# Patient Record
Sex: Female | Born: 1960
Health system: Southern US, Community
[De-identification: ages and names within clinical notes are randomized; demographics above are authoritative.]

## PROBLEM LIST (undated history)

## (undated) DIAGNOSIS — M1990 Unspecified osteoarthritis, unspecified site: Secondary | ICD-10-CM

## (undated) DIAGNOSIS — I10 Essential (primary) hypertension: Secondary | ICD-10-CM

## (undated) DIAGNOSIS — D649 Anemia, unspecified: Secondary | ICD-10-CM

## (undated) DIAGNOSIS — F419 Anxiety disorder, unspecified: Secondary | ICD-10-CM

## (undated) DIAGNOSIS — G4700 Insomnia, unspecified: Secondary | ICD-10-CM

## (undated) HISTORY — PX: REDUCTION MAMMAPLASTY: SUR839

## (undated) HISTORY — PX: ABDOMINAL HYSTERECTOMY: SHX81

## (undated) HISTORY — DX: Insomnia, unspecified: G47.00

---

## 1998-02-21 ENCOUNTER — Ambulatory Visit (HOSPITAL_COMMUNITY): Admission: RE | Admit: 1998-02-21 | Discharge: 1998-02-21 | Payer: Self-pay | Admitting: Emergency Medicine

## 1998-07-10 ENCOUNTER — Ambulatory Visit (HOSPITAL_COMMUNITY): Admission: RE | Admit: 1998-07-10 | Discharge: 1998-07-10 | Payer: Self-pay | Admitting: Emergency Medicine

## 1998-12-11 ENCOUNTER — Ambulatory Visit (HOSPITAL_COMMUNITY): Admission: RE | Admit: 1998-12-11 | Discharge: 1998-12-11 | Payer: Self-pay | Admitting: Internal Medicine

## 1999-01-20 ENCOUNTER — Emergency Department (HOSPITAL_COMMUNITY): Admission: EM | Admit: 1999-01-20 | Discharge: 1999-01-20 | Payer: Self-pay | Admitting: Emergency Medicine

## 1999-01-20 ENCOUNTER — Encounter: Payer: Self-pay | Admitting: Emergency Medicine

## 1999-12-27 ENCOUNTER — Ambulatory Visit (HOSPITAL_COMMUNITY): Admission: RE | Admit: 1999-12-27 | Discharge: 1999-12-27 | Payer: Self-pay | Admitting: *Deleted

## 1999-12-27 ENCOUNTER — Encounter: Payer: Self-pay | Admitting: *Deleted

## 2000-01-31 ENCOUNTER — Ambulatory Visit (HOSPITAL_COMMUNITY): Admission: RE | Admit: 2000-01-31 | Discharge: 2000-01-31 | Payer: Self-pay | Admitting: Emergency Medicine

## 2001-09-28 ENCOUNTER — Ambulatory Visit (HOSPITAL_COMMUNITY): Admission: RE | Admit: 2001-09-28 | Discharge: 2001-09-28 | Payer: Self-pay | Admitting: Family Medicine

## 2004-09-14 ENCOUNTER — Emergency Department (HOSPITAL_COMMUNITY): Admission: EM | Admit: 2004-09-14 | Discharge: 2004-09-15 | Payer: Self-pay | Admitting: *Deleted

## 2005-03-11 ENCOUNTER — Encounter: Admission: RE | Admit: 2005-03-11 | Discharge: 2005-03-11 | Payer: Self-pay | Admitting: Family Medicine

## 2005-12-03 ENCOUNTER — Ambulatory Visit (HOSPITAL_COMMUNITY): Admission: RE | Admit: 2005-12-03 | Discharge: 2005-12-03 | Payer: Self-pay | Admitting: Family Medicine

## 2005-12-18 ENCOUNTER — Ambulatory Visit (HOSPITAL_COMMUNITY): Admission: RE | Admit: 2005-12-18 | Discharge: 2005-12-18 | Payer: Self-pay | Admitting: Family Medicine

## 2005-12-20 ENCOUNTER — Ambulatory Visit (HOSPITAL_COMMUNITY): Admission: RE | Admit: 2005-12-20 | Discharge: 2005-12-20 | Payer: Self-pay | Admitting: Family Medicine

## 2006-02-19 ENCOUNTER — Ambulatory Visit: Payer: Self-pay | Admitting: Hematology and Oncology

## 2006-04-29 ENCOUNTER — Emergency Department (HOSPITAL_COMMUNITY): Admission: EM | Admit: 2006-04-29 | Discharge: 2006-04-29 | Payer: Self-pay | Admitting: Family Medicine

## 2006-08-11 ENCOUNTER — Encounter: Admission: RE | Admit: 2006-08-11 | Discharge: 2006-08-11 | Payer: Self-pay | Admitting: Family Medicine

## 2006-10-17 ENCOUNTER — Ambulatory Visit (HOSPITAL_COMMUNITY): Admission: RE | Admit: 2006-10-17 | Discharge: 2006-10-17 | Payer: Self-pay | Admitting: Family Medicine

## 2007-04-10 ENCOUNTER — Ambulatory Visit (HOSPITAL_COMMUNITY): Admission: RE | Admit: 2007-04-10 | Discharge: 2007-04-10 | Payer: Self-pay | Admitting: Orthopedic Surgery

## 2010-02-12 ENCOUNTER — Encounter: Admission: RE | Admit: 2010-02-12 | Discharge: 2010-02-12 | Payer: Self-pay | Admitting: Obstetrics and Gynecology

## 2010-06-26 ENCOUNTER — Telehealth (INDEPENDENT_AMBULATORY_CARE_PROVIDER_SITE_OTHER): Payer: Self-pay | Admitting: *Deleted

## 2010-06-26 ENCOUNTER — Encounter (INDEPENDENT_AMBULATORY_CARE_PROVIDER_SITE_OTHER): Payer: Self-pay | Admitting: *Deleted

## 2010-07-03 ENCOUNTER — Ambulatory Visit: Payer: Self-pay | Admitting: Gastroenterology

## 2010-07-06 ENCOUNTER — Encounter: Payer: Self-pay | Admitting: Gastroenterology

## 2010-08-26 ENCOUNTER — Encounter: Payer: Self-pay | Admitting: Family Medicine

## 2010-09-04 ENCOUNTER — Ambulatory Visit (HOSPITAL_COMMUNITY)
Admission: RE | Admit: 2010-09-04 | Discharge: 2010-09-04 | Payer: Self-pay | Source: Home / Self Care | Attending: Family Medicine | Admitting: Family Medicine

## 2010-09-06 NOTE — Letter (Signed)
Summary: [facility] Instructions  [facility]  [address] [place], [place] [address]   Phone: [phone]  Fax: [phone]       Heather Diaz    1961/04/20    MRN: [id number]        Procedure Day /Date:07/03/10  TUE     Arrival Time:730 am     Procedure Time:8 am     Location of Procedure:                    X  [facility] (4th Floor)                        PREPARATION FOR COLONOSCOPY WITH MOVIPREP   Starting 5 days prior to your procedure 06/28/10 do not eat nuts, seeds, popcorn, corn, beans, peas,  salads, or any raw vegetables.  Do not take any fiber supplements (e.g. Metamucil, Citrucel, and Benefiber).  THE DAY BEFORE YOUR PROCEDURE         DATE: 07/02/10  DAY: MON  1.  Drink clear liquids the entire day-NO SOLID FOOD  2.  Do not drink anything colored red or purple.  Avoid juices with pulp.  No orange juice.  3.  Drink at least 64 oz. (8 glasses) of fluid/clear liquids during the day to prevent dehydration and help the prep work efficiently.  CLEAR LIQUIDS INCLUDE: Water Jello Ice Popsicles Tea (sugar ok, no milk/cream) Powdered fruit flavored drinks Coffee (sugar ok, no milk/cream) Gatorade Juice: apple, white grape, white cranberry  Lemonade Clear bullion, consomm, broth Carbonated beverages (any kind) Strained chicken noodle soup Hard Candy                             4.  In the morning, mix first dose of MoviPrep solution:    Empty 1 Pouch A and 1 Pouch B into the disposable container    Add lukewarm drinking water to the top line of the container. Mix to dissolve    Refrigerate (mixed solution should be used within 24 hrs)  5.  Begin drinking the prep at 5:00 p.m. The MoviPrep container is divided by 4 marks.   Every 15 minutes drink the solution down to the next mark (approximately 8 oz) until the full liter is complete.   6.  Follow completed prep with 16 oz of clear liquid of your choice (Nothing red or purple).   Continue to drink clear liquids until bedtime.  7.  Before going to bed, mix second dose of MoviPrep solution:    Empty 1 Pouch A and 1 Pouch B into the disposable container    Add lukewarm drinking water to the top line of the container. Mix to dissolve    Refrigerate  THE DAY OF YOUR PROCEDURE      DATE:07/03/10  DAY: TUE  Beginning at 3 a.m. (5 hours before procedure):         1. Every 15 minutes, drink the solution down to the next mark (approx 8 oz) until the full liter is complete.  2. Follow completed prep with 16 oz. of clear liquid of your choice.    3. You may drink clear liquids until 6 am (2 HOURS BEFORE PROCEDURE).   MEDICATION INSTRUCTIONS  Unless otherwise instructed, you should take regular prescription medications with a small sip of water   as early as possible the morning of your procedure.  OTHER INSTRUCTIONS  You will need a responsible adult at least 52 years of age to accompany you and drive you home.   This person must remain in the waiting room during your procedure.  Wear loose fitting clothing that is easily removed.  Leave jewelry and other valuables at home.  However, you may wish to bring a book to read or  an iPod/MP3 player to listen to music as you wait for your procedure to start.  Remove all body piercing jewelry and leave at home.  Total time from sign-in until discharge is approximately 2-3 hours.  You should go home directly after your procedure and rest.  You can resume normal activities the  day after your procedure.  The day of your procedure you should not:   Drive   Make legal decisions   Operate machinery   Drink alcohol   Return to work  You will receive specific instructions about eating, activities and medications before you leave.    The above instructions have been reviewed and explained to me by   Chales Abrahams CMA Duncan Dull)  June 26, 2010 8:34 AM     I fully understand and can verbalize  these instructions faxed to pt [phone] Date 06/26/10

## 2010-09-06 NOTE — Letter (Signed)
Summary: Results Letter  [facility]  [address] [place], [place] [address]   Phone: [phone]  Fax: [phone]        July 06, 2010 MRN: [id number]    Heather Diaz [address], [place]  [address]    [name],   As we spoke about already, the polyps removed during your recent procedure were proven to be adenomatous.  These are pre-cancerous polyps that may have grown into cancers if they had not been removed.  Based on current nationally recognized surveillance guidelines, I recommend that you have a repeat colonoscopy in 5 years.  We will therefore put your information in our reminder system and will contact you in 5 years to schedule a repeat procedure.  Please call if you have any questions or concerns.       Sincerely,  Rachael Fee MD  This letter has been electronically signed by your physician.  Appended Document: Results Letter Letter mailed

## 2010-09-06 NOTE — Progress Notes (Signed)
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  She needs a colonoscoyp (routine risk) before end of the year, preferably on a Monday. Can you look at [facility] times, find one that will fit for her.  I'm willing to start at 8am for her if needed, but I think I saw a couple holes in my [facility] schedule for next week.  Moveprep, thanks  call her at [facility] endo today [phone]) and her home number is [phone]  thank you ------------------------------

## 2010-09-06 NOTE — Procedures (Signed)
Summary: Colonoscopy  Patient: Heather Diaz Note: All result statuses are Final unless otherwise noted.  Tests: (1) Colonoscopy (COL)   COL Colonoscopy           DONE     [facility]     [address]     [place], [place]  [address]           COLONOSCOPY PROCEDURE REPORT           PATIENT:  Heather, Diaz  MR#:  [id number]     BIRTHDATE:  [birth year], 48 yrs. old  GENDER:  female     ENDOSCOPIST:  Rachael Fee, MD     PROCEDURE DATE:  07/03/2010     PROCEDURE:  Colonoscopy with biopsy and snare polypectomy     ASA CLASS:  Class II     INDICATIONS:  Routine Risk Screening     MEDICATIONS:   Fentanyl 75 mcg IV, Versed 9 mg IV     DESCRIPTION OF PROCEDURE:   After the risks benefits and     alternatives of the procedure were thoroughly explained, informed     consent was obtained.  Digital rectal exam was performed and     revealed no rectal masses.   The LB [id number]-[id number] endoscope     was introduced through the anus and advanced to the cecum, which     was identified by both the appendix and ileocecal valve, without     limitations.  The quality of the prep was good, using MoviPrep.     The instrument was then slowly withdrawn as the colon was fully     examined.     <<PROCEDUREIMAGES>>     FINDINGS:  There were two small polyps, both were located in     descending colon. One was 1mm across, removed with forceps, the     other was 4mm across, removed with cold snare. Both were retrieved     and sent to pathology (jar 1) (see image5 and image6).  Mild     diverticulosis was found in the sigmoid to descending colon     segments (see image1).  External hemorrhoids were found.  This was     otherwise a normal examination of the colon (see image2, image3,     and image7).   Retroflexed views in the rectum revealed no     abnormalities.    The scope was then withdrawn from the patient     and the procedure completed.     COMPLICATIONS:  None           ENDOSCOPIC  IMPRESSION:     1) Two small polyps, both removed and sent to pathology     2) Mild diverticulosis in the sigmoid to descending colon     segments     3) External hemorrhoids, small     4) Otherwise normal examination           RECOMMENDATIONS:     1) If the polyps removed today are proven to be adenomatous     (pre-cancerous) polyps, you will need a repeat colonoscopy in 5     years. Otherwise you should continue to follow colorectal cancer     screening guidelines for "routine risk" patients with colonoscopy     in 10 years.     2) You will receive a letter within 1-2 weeks with the results     of your biopsy as well as final recommendations. Please  call my     office if you have not received a letter after 3 weeks.           ______________________________     Rachael Fee, MD           n.     eSIGNED:   Rachael Fee at 07/03/2010 09:03 AM           Deatra Robinson, [id number]  Note: An exclamation mark (!) indicates a result that was not dispersed into the flowsheet. Document Creation Date: 07/03/2010 9:03 AM _______________________________________________________________________  (1) Order result status: Final Collection or observation date-time: 07/03/2010 08:56 Requested date-time:  Receipt date-time:  Reported date-time:  Referring Physician:   Ordering Physician: Rob Bunting [phone]) Specimen Source:  Source: Launa Grill Order Number: [id number] Lab site:   Appended Document: Colonoscopy     Procedures Next Due Date:    Colonoscopy: 06/2015

## 2010-09-06 NOTE — Progress Notes (Signed)
Summary: colon  Phone Note Outgoing Call Call back at Work Phone [phone]   Call placed by: Chales Abrahams CMA Duncan Dull),  June 26, 2010 8:32 AM Summary of Call: called and informed Heather Diaz of her procedure and faxed instructions. meds reviewed Initial call taken by: Chales Abrahams CMA Duncan Dull),  June 26, 2010 8:35 AM  New Problems: SPECIAL SCREENING FOR MALIGNANT NEOPLASMS COLON (ICD-V76.51)   New Problems: SPECIAL SCREENING FOR MALIGNANT NEOPLASMS COLON (ICD-V76.51) New/Updated Medications: MOVIPREP 100 GM  SOLR (PEG-KCL-NACL-NASULF-NA ASC-C) As per prep instructions. Prescriptions: MOVIPREP 100 GM  SOLR (PEG-KCL-NACL-NASULF-NA ASC-C) As per prep instructions.  #1 x 0   Entered by:   Chales Abrahams CMA (AAMA)   Authorized by:   Rachael Fee MD   Signed by:   Chales Abrahams CMA (AAMA) on 06/26/2010   Method used:   Electronically to        [facility] [phone]* (retail)       [address]       [place], [place]  [address]       Ph: [phone]       Fax: [phone]   RxID:   [id number]

## 2012-11-12 ENCOUNTER — Other Ambulatory Visit (HOSPITAL_COMMUNITY): Payer: Self-pay | Admitting: Family Medicine

## 2012-11-12 DIAGNOSIS — K8020 Calculus of gallbladder without cholecystitis without obstruction: Secondary | ICD-10-CM

## 2012-11-13 ENCOUNTER — Ambulatory Visit (HOSPITAL_COMMUNITY)
Admission: RE | Admit: 2012-11-13 | Discharge: 2012-11-13 | Disposition: A | Discharge status: Home / Self Care | Payer: 59 | Source: Ambulatory Visit | Attending: Family Medicine | Admitting: Family Medicine

## 2012-11-13 ENCOUNTER — Other Ambulatory Visit (HOSPITAL_COMMUNITY): Payer: Self-pay | Admitting: Family Medicine

## 2012-11-13 DIAGNOSIS — R109 Unspecified abdominal pain: Secondary | ICD-10-CM | POA: Insufficient documentation

## 2012-11-13 DIAGNOSIS — K769 Liver disease, unspecified: Secondary | ICD-10-CM

## 2012-11-13 DIAGNOSIS — K7689 Other specified diseases of liver: Secondary | ICD-10-CM | POA: Insufficient documentation

## 2012-11-13 DIAGNOSIS — K8020 Calculus of gallbladder without cholecystitis without obstruction: Secondary | ICD-10-CM

## 2012-11-13 MED ORDER — GADOBENATE DIMEGLUMINE 529 MG/ML IV SOLN
20.0000 mL | Freq: Once | INTRAVENOUS | Status: AC | PRN
  Administered 2012-11-13: 20 mL via INTRAVENOUS

## 2012-11-19 ENCOUNTER — Other Ambulatory Visit (HOSPITAL_COMMUNITY): Payer: 59

## 2013-04-07 ENCOUNTER — Other Ambulatory Visit (HOSPITAL_COMMUNITY): Payer: Self-pay | Admitting: Family Medicine

## 2013-04-07 ENCOUNTER — Other Ambulatory Visit: Payer: Self-pay

## 2013-04-07 DIAGNOSIS — Z1231 Encounter for screening mammogram for malignant neoplasm of breast: Secondary | ICD-10-CM

## 2013-04-08 ENCOUNTER — Ambulatory Visit (HOSPITAL_COMMUNITY): Payer: 59

## 2013-04-19 ENCOUNTER — Ambulatory Visit (HOSPITAL_COMMUNITY)
Admission: RE | Admit: 2013-04-19 | Discharge: 2013-04-19 | Disposition: A | Discharge status: Home / Self Care | Payer: 59 | Source: Ambulatory Visit | Attending: Family Medicine | Admitting: Family Medicine

## 2013-04-19 DIAGNOSIS — Z1231 Encounter for screening mammogram for malignant neoplasm of breast: Secondary | ICD-10-CM | POA: Insufficient documentation

## 2013-04-21 ENCOUNTER — Other Ambulatory Visit: Payer: Self-pay | Admitting: Family Medicine

## 2013-04-21 DIAGNOSIS — R928 Other abnormal and inconclusive findings on diagnostic imaging of breast: Secondary | ICD-10-CM

## 2013-05-07 ENCOUNTER — Ambulatory Visit
Admission: RE | Admit: 2013-05-07 | Discharge: 2013-05-07 | Disposition: A | Discharge status: Home / Self Care | Payer: 59 | Source: Ambulatory Visit | Attending: Family Medicine | Admitting: Family Medicine

## 2013-05-07 DIAGNOSIS — R928 Other abnormal and inconclusive findings on diagnostic imaging of breast: Secondary | ICD-10-CM

## 2013-12-13 ENCOUNTER — Ambulatory Visit: Payer: 59 | Admitting: *Deleted

## 2014-01-28 ENCOUNTER — Ambulatory Visit: Payer: 59 | Admitting: Dietician

## 2014-02-14 ENCOUNTER — Ambulatory Visit: Payer: 59 | Admitting: *Deleted

## 2014-03-12 ENCOUNTER — Encounter: Payer: 59 | Attending: Family Medicine | Admitting: Dietician

## 2014-03-12 DIAGNOSIS — Z713 Dietary counseling and surveillance: Secondary | ICD-10-CM | POA: Insufficient documentation

## 2014-03-16 NOTE — Progress Notes (Signed)
Patient was seen on 03/12/2014 for the Weight Loss Class at the [facility]. The following learning objectives were met by the patient during this class:   Describe healthy choices in each food group  Describe portion size of foods  Use plate method for meal planning  Demonstrate how to read Nutrition Facts food label  Set realistic goals for weight loss, diet changes, and physical activity.   Goals:  1. Make healthy food choices in each food group.  2. Reduce portion size of foods.  3. Increase fruit and vegetable intake.  4. Use plate method for meal planning.  5. Increase physical activity.   Handouts given:  1. Weight loss tips 2. Meal plan/portion card 2. Plate method  2. Food label handout   

## 2014-03-16 NOTE — Progress Notes (Deleted)
Subjective:    Patient was seen on 03/12/2014 for the Weight Loss Class at the [facility]. The following learning objectives were met by the patient during this class:   Describe healthy choices in each food group  Describe portion size of foods  Use plate method for meal planning  Demonstrate how to read Nutrition Facts food label  Set realistic goals for weight loss, diet changes, and physical activity.   Goals:  1. Make healthy food choices in each food group.  2. Reduce portion size of foods.  3. Increase fruit and vegetable intake.  4. Use plate method for meal planning.  5. Increase physical activity.   Handouts given:  1. Weight loss tips 2. Meal plan/portion card 2. Plate method  2. Food label handout     HPI   Review of Systems     Objective:   Physical Exam     Assessment:         Plan:

## 2014-11-18 ENCOUNTER — Other Ambulatory Visit: Payer: Self-pay | Admitting: *Deleted

## 2014-11-18 NOTE — Patient Outreach (Addendum)
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 She says she is staying on track. Sent return e-mail congratulating her on the successful healthy lifestyle changes and wished her continued success.

## 2014-12-29 ENCOUNTER — Other Ambulatory Visit (HOSPITAL_COMMUNITY): Payer: Self-pay | Admitting: Orthopedic Surgery

## 2014-12-29 DIAGNOSIS — M25511 Pain in right shoulder: Secondary | ICD-10-CM

## 2015-01-20 ENCOUNTER — Ambulatory Visit (HOSPITAL_COMMUNITY): Payer: 59

## 2015-01-20 ENCOUNTER — Other Ambulatory Visit (HOSPITAL_COMMUNITY): Payer: 59

## 2015-02-13 ENCOUNTER — Ambulatory Visit (HOSPITAL_COMMUNITY): Admission: RE | Admit: 2015-02-13 | Payer: 59 | Source: Ambulatory Visit

## 2015-06-09 ENCOUNTER — Encounter: Payer: Self-pay | Admitting: Gastroenterology

## 2015-07-07 ENCOUNTER — Other Ambulatory Visit: Payer: Self-pay | Admitting: *Deleted

## 2015-07-07 NOTE — Patient Outreach (Signed)
Secure e-mail sent to Clydie BraunKaren requesting she schedule [facility] follow up appointment for self management assistance with weight loss and HTN. Bary RichardJanet S. Idil Maslanka RN,CCM,CDE [facility] Care Management Coordinator [facility] Office Phone [phone] Office Fax [phone]

## 2015-08-23 ENCOUNTER — Telehealth: Payer: Self-pay

## 2015-08-30 ENCOUNTER — Telehealth: Payer: Self-pay

## 2015-09-06 DIAGNOSIS — H524 Presbyopia: Secondary | ICD-10-CM | POA: Diagnosis not present

## 2015-09-06 DIAGNOSIS — H5213 Myopia, bilateral: Secondary | ICD-10-CM | POA: Diagnosis not present

## 2015-09-22 DIAGNOSIS — Z0000 Encounter for general adult medical examination without abnormal findings: Secondary | ICD-10-CM | POA: Diagnosis not present

## 2015-09-22 DIAGNOSIS — I10 Essential (primary) hypertension: Secondary | ICD-10-CM | POA: Diagnosis not present

## 2015-09-22 DIAGNOSIS — G4700 Insomnia, unspecified: Secondary | ICD-10-CM | POA: Diagnosis not present

## 2015-09-22 DIAGNOSIS — F419 Anxiety disorder, unspecified: Secondary | ICD-10-CM | POA: Diagnosis not present

## 2015-09-22 DIAGNOSIS — R232 Flushing: Secondary | ICD-10-CM | POA: Diagnosis not present

## 2015-09-22 MED FILL — TRIAMTERENE/HCTZ 75/50 TAB: 75-50 | 90 days supply | Qty: 90 | Fill #0

## 2015-09-22 MED FILL — ESTRADIOL 0.1 MG PATCH: 0.1 | 84 days supply | Qty: 24 | Fill #0

## 2015-09-22 MED FILL — ZOLPIDEM TARTRATE 10 MG TAB: 10 | 90 days supply | Qty: 90 | Fill #0

## 2015-09-22 MED FILL — LISINOPRIL 5 MG TABLET: 5 | 30 days supply | Qty: 30 | Fill #0

## 2015-09-22 MED FILL — METOPROLOL SUCC ER 200 MG T: 200 | 90 days supply | Qty: 90 | Fill #0

## 2015-09-27 ENCOUNTER — Telehealth: Payer: Self-pay

## 2015-10-26 ENCOUNTER — Encounter: Payer: Self-pay | Admitting: *Deleted

## 2015-10-26 ENCOUNTER — Other Ambulatory Visit: Payer: Self-pay | Admitting: *Deleted

## 2015-10-26 NOTE — Patient Outreach (Signed)
Clydie BraunKaren has not been seen for HTN [facility] follow up since 07/06/14 and was a no show for her appointment on 10/26/15 at 9:30 am therefore a termination letter is being sent to her for failure to keep the program appointment policy. Bary RichardJanet S. Ladan Vanderzanden RN,CCM,CDE [facility] Care Management Coordinator [facility] Office Phone [phone] Office Fax [phone]

## 2015-12-18 MED FILL — ZOLPIDEM TARTRATE 10 MG TAB: 10 | 90 days supply | Qty: 90 | Fill #1

## 2015-12-20 MED FILL — ESTRADIOL 0.1 MG PATCH: 0.1 | 84 days supply | Qty: 24 | Fill #1

## 2016-03-18 DIAGNOSIS — G4700 Insomnia, unspecified: Secondary | ICD-10-CM | POA: Diagnosis not present

## 2016-03-18 DIAGNOSIS — I10 Essential (primary) hypertension: Secondary | ICD-10-CM | POA: Diagnosis not present

## 2016-03-18 DIAGNOSIS — E6609 Other obesity due to excess calories: Secondary | ICD-10-CM | POA: Diagnosis not present

## 2016-03-18 MED FILL — METOPROLOL SUCC ER 200 MG T: 200 | 90 days supply | Qty: 90 | Fill #0

## 2016-03-18 MED FILL — ZOLPIDEM TART ER 12.5 MG TA: 12.5 | 30 days supply | Qty: 30 | Fill #0

## 2016-03-18 MED FILL — ALPRAZolam 0.5 MG TABS: 0.5 | 30 days supply | Qty: 30 | Fill #0

## 2016-03-18 MED FILL — TRIAMTERENE/HCTZ 75/50 TAB: 75-50 | 90 days supply | Qty: 90 | Fill #0

## 2016-04-16 MED FILL — ESTRADIOL 0.1 MG PATCH: 0.1 | 84 days supply | Qty: 24 | Fill #0

## 2016-04-16 MED FILL — ZOLPIDEM TART ER 12.5 MG TA: 12.5 | 30 days supply | Qty: 30 | Fill #1 | Status: TO

## 2016-05-16 MED FILL — ZOLPIDEM TART ER 12.5 MG TA: 12.5 | 30 days supply | Qty: 30 | Fill #0 | Status: TO

## 2016-06-11 MED FILL — ZOLPIDEM TART ER 12.5 MG TA: 12.5 | 90 days supply | Qty: 90 | Fill #0

## 2016-07-09 MED FILL — ZOLPIDEM TARTRATE 10 MG TAB: 10 | 90 days supply | Qty: 90 | Fill #0

## 2016-09-23 DIAGNOSIS — Z1211 Encounter for screening for malignant neoplasm of colon: Secondary | ICD-10-CM | POA: Diagnosis not present

## 2016-09-23 DIAGNOSIS — L989 Disorder of the skin and subcutaneous tissue, unspecified: Secondary | ICD-10-CM | POA: Diagnosis not present

## 2016-09-23 DIAGNOSIS — F5101 Primary insomnia: Secondary | ICD-10-CM | POA: Diagnosis not present

## 2016-09-23 DIAGNOSIS — N951 Menopausal and female climacteric states: Secondary | ICD-10-CM | POA: Diagnosis not present

## 2016-09-23 DIAGNOSIS — I10 Essential (primary) hypertension: Secondary | ICD-10-CM | POA: Diagnosis not present

## 2016-09-23 DIAGNOSIS — F4010 Social phobia, unspecified: Secondary | ICD-10-CM | POA: Diagnosis not present

## 2016-09-23 DIAGNOSIS — Z0000 Encounter for general adult medical examination without abnormal findings: Secondary | ICD-10-CM | POA: Diagnosis not present

## 2016-09-23 MED FILL — ESTRADIOL 0.1 MG PATCH: 0.1 | 84 days supply | Qty: 24 | Fill #0

## 2016-09-24 DIAGNOSIS — H5213 Myopia, bilateral: Secondary | ICD-10-CM | POA: Diagnosis not present

## 2016-09-24 DIAGNOSIS — H524 Presbyopia: Secondary | ICD-10-CM | POA: Diagnosis not present

## 2016-09-25 MED FILL — XIIDRA 5% EYE DROPS: 5 | 30 days supply | Qty: 60 | Fill #0

## 2016-09-27 MED FILL — TRINTELLIX 10 MG TABLET: 10 | 30 days supply | Qty: 30 | Fill #0

## 2016-11-12 MED FILL — DESVENLAFAXINE SUC ER 25 MG: 25 | 30 days supply | Qty: 30 | Fill #0

## 2016-11-25 MED FILL — ZOLPIDEM TARTRATE 10 MG TAB: 10 | 90 days supply | Qty: 90 | Fill #1

## 2017-01-20 ENCOUNTER — Telehealth: Payer: 59 | Admitting: Family

## 2017-01-20 DIAGNOSIS — L255 Unspecified contact dermatitis due to plants, except food: Secondary | ICD-10-CM | POA: Diagnosis not present

## 2017-01-20 MED ORDER — PREDNISONE 10 MG (21) PO TBPK: ORAL_TABLET | ORAL | 0 refills | Status: DC

## 2017-01-20 MED FILL — TRIAMCINOLONE 0.1% CREAM: 0.1 | 10 days supply | Qty: 30 | Fill #0

## 2017-01-20 NOTE — Progress Notes (Signed)

## 2017-02-21 MED FILL — ZOLPIDEM TARTRATE 10 MG TAB: 10 | 90 days supply | Qty: 90 | Fill #0

## 2017-05-22 MED FILL — ZOLPIDEM TARTRATE 10 MG TAB: 10 | 90 days supply | Qty: 90 | Fill #0 | Status: TO

## 2017-08-15 MED FILL — ESTRADIOL 0.1 MG PATCH: 0.1 | 84 days supply | Qty: 24 | Fill #1

## 2017-08-19 MED FILL — ZOLPIDEM TARTRATE 10 MG TAB: 10 | 90 days supply | Qty: 90 | Fill #0

## 2017-08-19 MED FILL — XIIDRA 5% EYE DROPS: 5 | 30 days supply | Qty: 60 | Fill #0

## 2017-08-20 DIAGNOSIS — H5213 Myopia, bilateral: Secondary | ICD-10-CM | POA: Diagnosis not present

## 2017-08-20 DIAGNOSIS — H524 Presbyopia: Secondary | ICD-10-CM | POA: Diagnosis not present

## 2017-10-01 DIAGNOSIS — L920 Granuloma annulare: Secondary | ICD-10-CM | POA: Diagnosis not present

## 2017-10-01 DIAGNOSIS — M25561 Pain in right knee: Secondary | ICD-10-CM | POA: Diagnosis not present

## 2017-10-01 MED FILL — ACETAMINOPHEN/COD #3 TABLET: 300-30 | 5 days supply | Qty: 30 | Fill #0

## 2017-10-01 MED FILL — CLOBETASOL 0.05% CREAM: 0.05 | 20 days supply | Qty: 60 | Fill #0

## 2017-10-01 MED FILL — MELOXICAM 15 MG TABLET: 15 | 14 days supply | Qty: 14 | Fill #0

## 2017-10-30 ENCOUNTER — Encounter (INDEPENDENT_AMBULATORY_CARE_PROVIDER_SITE_OTHER): Payer: Self-pay | Admitting: Orthopaedic Surgery

## 2017-10-30 ENCOUNTER — Ambulatory Visit (INDEPENDENT_AMBULATORY_CARE_PROVIDER_SITE_OTHER): Payer: 59

## 2017-10-30 ENCOUNTER — Ambulatory Visit (INDEPENDENT_AMBULATORY_CARE_PROVIDER_SITE_OTHER): Payer: 59 | Admitting: Orthopaedic Surgery

## 2017-10-30 DIAGNOSIS — M25561 Pain in right knee: Secondary | ICD-10-CM

## 2017-10-30 DIAGNOSIS — G8929 Other chronic pain: Secondary | ICD-10-CM | POA: Diagnosis not present

## 2017-10-30 MED ORDER — MELOXICAM 7.5 MG PO TABS: 7.5000 mg | ORAL_TABLET | Freq: Two times a day (BID) | ORAL | 2 refills | Status: DC | PRN

## 2017-10-30 MED ORDER — METHYLPREDNISOLONE ACETATE 40 MG/ML IJ SUSP
40.0000 mg | INTRAMUSCULAR | Status: AC | PRN
  Administered 2017-10-30: 40 mg via INTRA_ARTICULAR

## 2017-10-30 MED ORDER — BUPIVACAINE HCL 0.5 % IJ SOLN
2.0000 mL | INTRAMUSCULAR | Status: AC | PRN
  Administered 2017-10-30: 2 mL via INTRA_ARTICULAR

## 2017-10-30 MED ORDER — DICLOFENAC SODIUM 1 % TD GEL: 2.0000 g | Freq: Four times a day (QID) | TRANSDERMAL | 5 refills | Status: DC

## 2017-10-30 MED ORDER — LIDOCAINE HCL 1 % IJ SOLN
2.0000 mL | INTRAMUSCULAR | Status: AC | PRN
  Administered 2017-10-30: 2 mL

## 2017-10-30 MED FILL — MELOXICAM 7.5 MG TABLET: 7.5 | 30 days supply | Qty: 60 | Fill #0

## 2017-10-30 MED FILL — DICLOFENAC SODIUM 1% GEL: 1 | 12 days supply | Qty: 100 | Fill #0

## 2017-10-30 NOTE — Progress Notes (Signed)
Office Visit Note   Patient: Heather Diaz           Date of Birth: 1960-11-30           MRN: [id number] Visit Date: 10/30/2017              Requested by: Laurann Montana, MD [phone] [address] [place], [place] [address] PCP: Laurann Montana, MD   Assessment & Plan: Visit Diagnoses:  1. Chronic pain of right knee     Plan: Impression is right knee osteoarthritis.  Cortisone injection performed today.  We did discuss the overall condition.  She is working on diet and exercise.  Prescription for Voltaren gel and meloxicam.  Questions encouraged and answered.  I gave her a pamphlet on HA injections.  Follow-Up Instructions: Return if symptoms worsen or fail to improve.   Orders:  Orders Placed This Encounter  Procedures  . XR KNEE 3 VIEW RIGHT   Meds ordered this encounter  Medications  . diclofenac sodium (VOLTAREN) 1 % GEL    Sig: Apply 2 g topically 4 (four) times daily.    Dispense:  1 Tube    Refill:  5  . meloxicam (MOBIC) 7.5 MG tablet    Sig: Take 1 tablet (7.5 mg total) by mouth 2 (two) times daily as needed for pain.    Dispense:  60 tablet    Refill:  2      Procedures: Large Joint Inj: R knee on 10/30/2017 9:29 AM Indications: pain Details: 22 G needle  Arthrogram: No  Medications: 40 mg methylPREDNISolone acetate 40 MG/ML; 2 mL lidocaine 1 %; 2 mL bupivacaine 0.5 % Outcome: tolerated well, no immediate complications Consent was given by the patient. Patient was prepped and draped in the usual sterile fashion.       Clinical Data: No additional findings.   Subjective: Chief Complaint  Patient presents with  . 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  She does endorse start up stiffness.  Symptoms.  She does endorse some popping but no giving way.  She does  endorse some occasional pain radiating down the leg and across the front of the knee.  Meloxicam does help partially.  Denies any numbness and tingling.   Review of Systems  Constitutional: Negative.   HENT: Negative.   Eyes: Negative.   Respiratory: Negative.   Cardiovascular: Negative.   Endocrine: Negative.   Musculoskeletal: Negative.   Neurological: Negative.   Hematological: Negative.   Psychiatric/Behavioral: Negative.   All other systems reviewed and are negative.    Objective: Vital Signs: There were no vitals taken for this visit.  Physical Exam  Constitutional: She is oriented to person, place, and time. She appears well-developed and well-nourished.  HENT:  Head: Normocephalic and atraumatic.  Eyes: EOM are normal.  Neck: Neck supple.  Pulmonary/Chest: Effort normal.  Abdominal: Soft.  Neurological: She is alert and oriented to person, place, and time.  Skin: Skin is warm. Capillary refill takes less than 2 seconds.  Psychiatric: She has a normal mood and affect. Her behavior is normal. Judgment and thought content normal.  Nursing note and vitals reviewed.   Ortho Exam Right knee exam shows no joint effusion.  Collaterals and cruciates are stable.  1+ patellofemoral. Specialty Comments:  No specialty comments available.  Imaging: Xr Knee 3 View Right  Result Date: 10/30/2017 Mild osteoarthritis.    PMFS History: There are no active problems to display for this patient.  No past medical history on file.  No family history on file.   Social History   Occupational History  . Not on file  Tobacco Use  . Smoking status: Never Smoker  . Smokeless tobacco: Never Used  Substance and Sexual Activity  . Alcohol use: Not on file  . Drug use: Not on file  . Sexual activity: Not on file

## 2017-11-17 MED FILL — ZOLPIDEM TARTRATE 10 MG TAB: 10 | 90 days supply | Qty: 90 | Fill #0

## 2017-12-17 ENCOUNTER — Telehealth (INDEPENDENT_AMBULATORY_CARE_PROVIDER_SITE_OTHER): Payer: Self-pay | Admitting: Orthopaedic Surgery

## 2017-12-17 NOTE — Telephone Encounter (Signed)
10/30/2017 ov note faxed to [facility] [phone]

## 2017-12-22 MED FILL — ESTRADIOL 0.05 MG/24HR PTTW: 0.05 | 84 days supply | Qty: 24 | Fill #0

## 2018-01-09 DIAGNOSIS — G4700 Insomnia, unspecified: Secondary | ICD-10-CM | POA: Diagnosis not present

## 2018-01-09 DIAGNOSIS — I10 Essential (primary) hypertension: Secondary | ICD-10-CM | POA: Diagnosis not present

## 2018-01-09 DIAGNOSIS — F419 Anxiety disorder, unspecified: Secondary | ICD-10-CM | POA: Diagnosis not present

## 2018-01-09 DIAGNOSIS — Z13220 Encounter for screening for lipoid disorders: Secondary | ICD-10-CM | POA: Diagnosis not present

## 2018-01-09 DIAGNOSIS — Z0000 Encounter for general adult medical examination without abnormal findings: Secondary | ICD-10-CM | POA: Diagnosis not present

## 2018-01-09 DIAGNOSIS — N951 Menopausal and female climacteric states: Secondary | ICD-10-CM | POA: Diagnosis not present

## 2018-01-09 MED FILL — METOPROLOL SUCCINATE ER 200: 200 | 90 days supply | Qty: 90 | Fill #0

## 2018-01-09 MED FILL — ZOLPIDEM TART ER 12.5 MG TA: 12.5 | 90 days supply | Qty: 90 | Fill #0

## 2018-01-09 MED FILL — TRIAMTERENE-HCTZ 75/50 TAB: 75-50 | 90 days supply | Qty: 90 | Fill #0

## 2018-01-15 DIAGNOSIS — Z131 Encounter for screening for diabetes mellitus: Secondary | ICD-10-CM | POA: Diagnosis not present

## 2018-01-15 DIAGNOSIS — E2839 Other primary ovarian failure: Secondary | ICD-10-CM | POA: Diagnosis not present

## 2018-01-15 DIAGNOSIS — E669 Obesity, unspecified: Secondary | ICD-10-CM | POA: Diagnosis not present

## 2018-01-15 DIAGNOSIS — I10 Essential (primary) hypertension: Secondary | ICD-10-CM | POA: Diagnosis not present

## 2018-01-15 DIAGNOSIS — R5383 Other fatigue: Secondary | ICD-10-CM | POA: Diagnosis not present

## 2018-01-15 DIAGNOSIS — E559 Vitamin D deficiency, unspecified: Secondary | ICD-10-CM | POA: Diagnosis not present

## 2018-01-28 DIAGNOSIS — R5383 Other fatigue: Secondary | ICD-10-CM | POA: Diagnosis not present

## 2018-01-28 DIAGNOSIS — E669 Obesity, unspecified: Secondary | ICD-10-CM | POA: Diagnosis not present

## 2018-01-28 DIAGNOSIS — E2839 Other primary ovarian failure: Secondary | ICD-10-CM | POA: Diagnosis not present

## 2018-01-28 DIAGNOSIS — I10 Essential (primary) hypertension: Secondary | ICD-10-CM | POA: Diagnosis not present

## 2018-01-28 MED FILL — PROGESTERONE 100 MG CAPSULE: 100 | 30 days supply | Qty: 30 | Fill #0

## 2018-01-28 MED FILL — ESTRADIOL 0.5 MG TABS: 0.5 | 30 days supply | Qty: 30 | Fill #0

## 2018-02-26 MED FILL — ZOLPIDEM TARTRATE 10 MG TAB: 10 | 90 days supply | Qty: 90 | Fill #0

## 2018-03-31 DIAGNOSIS — I10 Essential (primary) hypertension: Secondary | ICD-10-CM | POA: Diagnosis not present

## 2018-03-31 DIAGNOSIS — E669 Obesity, unspecified: Secondary | ICD-10-CM | POA: Diagnosis not present

## 2018-03-31 DIAGNOSIS — E559 Vitamin D deficiency, unspecified: Secondary | ICD-10-CM | POA: Diagnosis not present

## 2018-03-31 DIAGNOSIS — G4700 Insomnia, unspecified: Secondary | ICD-10-CM | POA: Diagnosis not present

## 2018-03-31 DIAGNOSIS — E2839 Other primary ovarian failure: Secondary | ICD-10-CM | POA: Diagnosis not present

## 2018-05-25 MED FILL — ZOLPIDEM TARTRATE 10 MG TAB: 10 | 90 days supply | Qty: 90 | Fill #1

## 2018-07-27 MED FILL — ESTRADIOL 0.05 MG/24HR PTTW: 0.05 | 84 days supply | Qty: 24 | Fill #1

## 2018-08-24 MED FILL — ZOLPIDEM TARTRATE 10 MG TAB: 10 | 90 days supply | Qty: 90 | Fill #0

## 2018-09-25 MED FILL — ESTRADIOL 0.075 MG/24HR PTT: 0.075 | 84 days supply | Qty: 24 | Fill #0

## 2018-10-21 MED FILL — TRIAMTERENE-HCTZ 75/50 TAB: 75-50 | 90 days supply | Qty: 90 | Fill #1

## 2018-11-09 MED FILL — ALPRAZolam 0.5 MG TABS: 0.5 | 30 days supply | Qty: 30 | Fill #0

## 2018-11-18 MED FILL — ZOLPIDEM TARTRATE 10 MG TAB: 10 | 90 days supply | Qty: 90 | Fill #1

## 2018-12-05 MED FILL — ALPRAZolam 0.5 MG TABS: 0.5 | 30 days supply | Qty: 30 | Fill #0

## 2019-02-15 MED FILL — ESTRADIOL 0.075 MG/24HR PTT: 0.075 | 84 days supply | Qty: 24 | Fill #1

## 2019-02-15 MED FILL — ZOLPIDEM TARTRATE 10 MG TAB: 10 | 90 days supply | Qty: 90 | Fill #0

## 2019-02-15 MED FILL — ALPRAZolam 0.5 MG TABS: 0.5 | 30 days supply | Qty: 30 | Fill #0

## 2019-04-08 DIAGNOSIS — E2839 Other primary ovarian failure: Secondary | ICD-10-CM | POA: Diagnosis not present

## 2019-04-08 DIAGNOSIS — G4700 Insomnia, unspecified: Secondary | ICD-10-CM | POA: Diagnosis not present

## 2019-04-08 DIAGNOSIS — I10 Essential (primary) hypertension: Secondary | ICD-10-CM | POA: Diagnosis not present

## 2019-04-08 DIAGNOSIS — F419 Anxiety disorder, unspecified: Secondary | ICD-10-CM | POA: Diagnosis not present

## 2019-04-08 DIAGNOSIS — D569 Thalassemia, unspecified: Secondary | ICD-10-CM | POA: Diagnosis not present

## 2019-04-08 DIAGNOSIS — Z0000 Encounter for general adult medical examination without abnormal findings: Secondary | ICD-10-CM | POA: Diagnosis not present

## 2019-04-08 MED FILL — METOPROLOL SUCCINATE ER 100: 100 | 90 days supply | Qty: 90 | Fill #0

## 2019-04-08 MED FILL — ALPRAZolam 0.5 MG TABS: 0.5 | 30 days supply | Qty: 30 | Fill #0

## 2019-04-08 MED FILL — TRIAMTERENE-HCTZ 75/50 TAB: 75-50 | 90 days supply | Qty: 90 | Fill #0

## 2019-04-15 ENCOUNTER — Other Ambulatory Visit: Payer: Self-pay | Admitting: Family Medicine

## 2019-04-15 DIAGNOSIS — Z1231 Encounter for screening mammogram for malignant neoplasm of breast: Secondary | ICD-10-CM

## 2019-04-15 DIAGNOSIS — E2839 Other primary ovarian failure: Secondary | ICD-10-CM

## 2019-04-27 MED FILL — ALPRAZolam 0.5 MG TABS: 0.5 | 30 days supply | Qty: 30 | Fill #0

## 2019-04-27 MED FILL — METOPROLOL SUCCINATE ER 100: 100 | 90 days supply | Qty: 90 | Fill #0

## 2019-04-27 MED FILL — TRIAMTERENE-HCTZ 75/50 TAB: 75-50 | 90 days supply | Qty: 90 | Fill #0

## 2019-05-15 MED FILL — ZOLPIDEM TARTRATE 10 MG TAB: 10 | 90 days supply | Qty: 90 | Fill #0

## 2019-05-27 DIAGNOSIS — D569 Thalassemia, unspecified: Secondary | ICD-10-CM | POA: Diagnosis not present

## 2019-05-27 DIAGNOSIS — I10 Essential (primary) hypertension: Secondary | ICD-10-CM | POA: Diagnosis not present

## 2019-06-29 ENCOUNTER — Ambulatory Visit
Admission: RE | Admit: 2019-06-29 | Discharge: 2019-06-29 | Disposition: A | Discharge status: Home / Self Care | Payer: 59 | Source: Ambulatory Visit | Attending: Family Medicine | Admitting: Family Medicine

## 2019-06-29 ENCOUNTER — Other Ambulatory Visit: Payer: Self-pay

## 2019-06-29 DIAGNOSIS — Z1231 Encounter for screening mammogram for malignant neoplasm of breast: Secondary | ICD-10-CM

## 2019-06-29 DIAGNOSIS — E2839 Other primary ovarian failure: Secondary | ICD-10-CM

## 2019-06-29 DIAGNOSIS — Z780 Asymptomatic menopausal state: Secondary | ICD-10-CM | POA: Diagnosis not present

## 2019-06-29 DIAGNOSIS — Z13820 Encounter for screening for osteoporosis: Secondary | ICD-10-CM | POA: Diagnosis not present

## 2019-07-12 ENCOUNTER — Encounter (HOSPITAL_COMMUNITY): Payer: Self-pay

## 2019-07-12 ENCOUNTER — Emergency Department (HOSPITAL_COMMUNITY)
Admission: EM | Admit: 2019-07-12 | Discharge: 2019-07-13 | Disposition: A | Discharge status: Home / Self Care | Payer: 59 | Attending: Emergency Medicine | Admitting: Emergency Medicine

## 2019-07-12 ENCOUNTER — Other Ambulatory Visit: Payer: Self-pay

## 2019-07-12 DIAGNOSIS — R519 Headache, unspecified: Secondary | ICD-10-CM | POA: Diagnosis not present

## 2019-07-12 DIAGNOSIS — Z79899 Other long term (current) drug therapy: Secondary | ICD-10-CM | POA: Diagnosis not present

## 2019-07-12 DIAGNOSIS — I10 Essential (primary) hypertension: Secondary | ICD-10-CM | POA: Insufficient documentation

## 2019-07-12 HISTORY — DX: Essential (primary) hypertension: I10

## 2019-07-12 MED ORDER — HYDRALAZINE HCL 20 MG/ML IJ SOLN
10.0000 mg | Freq: Once | INTRAMUSCULAR | Status: AC
  Administered 2019-07-13: 10 mg via INTRAVENOUS
  Filled 2019-07-12: qty 1

## 2019-07-12 NOTE — ED Notes (Signed)
Pt states she has right temporal headache. PT denies visual changes or blurry vision.

## 2019-07-12 NOTE — ED Notes (Signed)
Pt states took dose of her Maxide approximately 5pm

## 2019-07-12 NOTE — ED Triage Notes (Signed)
Pt reports head started hurting around 2pm today and she had her BP checked which was around 200/114. Pt reports taking Tylenol and Advil without any relief. Pt reports headache has gotten worse throughout the day. Pt reports taking BP medications today. Pt is A&O x4. Neuro is intact.

## 2019-07-13 ENCOUNTER — Emergency Department (HOSPITAL_COMMUNITY): Payer: 59

## 2019-07-13 DIAGNOSIS — R519 Headache, unspecified: Secondary | ICD-10-CM | POA: Diagnosis not present

## 2019-07-13 DIAGNOSIS — Z79899 Other long term (current) drug therapy: Secondary | ICD-10-CM | POA: Diagnosis not present

## 2019-07-13 DIAGNOSIS — I10 Essential (primary) hypertension: Secondary | ICD-10-CM | POA: Diagnosis not present

## 2019-07-13 LAB — CBC WITH DIFFERENTIAL/PLATELET
Abs Immature Granulocytes: 0.03 10*3/uL (ref 0.00–0.07)
Basophils Absolute: 0 10*3/uL (ref 0.0–0.1)
Basophils Relative: 0 %
Eosinophils Absolute: 0.2 10*3/uL (ref 0.0–0.5)
Eosinophils Relative: 2 %
HCT: 41.4 % (ref 36.0–46.0)
Hemoglobin: 11.7 g/dL — ABNORMAL LOW (ref 12.0–15.0)
Immature Granulocytes: 0 %
Lymphocytes Relative: 30 %
Lymphs Abs: 2.3 10*3/uL (ref 0.7–4.0)
MCH: 16.7 pg — ABNORMAL LOW (ref 26.0–34.0)
MCHC: 28.3 g/dL — ABNORMAL LOW (ref 30.0–36.0)
MCV: 59 fL — ABNORMAL LOW (ref 80.0–100.0)
Monocytes Absolute: 0.5 10*3/uL (ref 0.1–1.0)
Monocytes Relative: 6 %
Neutro Abs: 4.6 10*3/uL (ref 1.7–7.7)
Neutrophils Relative %: 62 %
Platelets: 310 10*3/uL (ref 150–400)
RBC: 7.02 MIL/uL — ABNORMAL HIGH (ref 3.87–5.11)
RDW: 19.1 % — ABNORMAL HIGH (ref 11.5–15.5)
WBC: 7.6 10*3/uL (ref 4.0–10.5)
nRBC: 0 % (ref 0.0–0.2)

## 2019-07-13 LAB — BASIC METABOLIC PANEL
Anion gap: 11 (ref 5–15)
BUN: 11 mg/dL (ref 6–20)
CO2: 28 mmol/L (ref 22–32)
Calcium: 9.9 mg/dL (ref 8.9–10.3)
Chloride: 98 mmol/L (ref 98–111)
Creatinine, Ser: 0.82 mg/dL (ref 0.44–1.00)
GFR calc Af Amer: >60 mL/min (ref >60)
GFR calc non Af Amer: >60 mL/min (ref >60)
Glucose, Bld: 124 mg/dL — ABNORMAL HIGH (ref 70–99)
Potassium: 3.2 mmol/L — ABNORMAL LOW (ref 3.5–5.1)
Sodium: 137 mmol/L (ref 135–145)

## 2019-07-13 MED ORDER — KETOROLAC TROMETHAMINE 30 MG/ML IJ SOLN
15.0000 mg | Freq: Once | INTRAMUSCULAR | Status: AC
  Administered 2019-07-13: 15 mg via INTRAVENOUS
  Filled 2019-07-13: qty 1

## 2019-07-13 NOTE — Discharge Instructions (Addendum)
Schedule a recheck of your blood pressure at your primary care doctor's office.

## 2019-07-13 NOTE — ED Provider Notes (Signed)
[facility] DEPT Provider Note   CSN: [id number] Arrival date & time: 07/12/19  2244     History   Chief Complaint No chief complaint on file.   HPI Heather Diaz is a 58 y.o. female.     Patient presents to the emergency department for evaluation of elevated blood pressure and headache.  She reports that she noticed a global headache earlier today.  She checked her blood pressure and it was elevated.  She took her scheduled medications but blood pressure did not improve.  She talked to her primary doctor who said she should go to urgent care or the ER.  She monitored her blood pressure which did not improve.  She comes to the ER stating that she is now experiencing predominantly right-sided headache.  She has not had any vision change.  She denies chest pain, heart palpitations, shortness of breath.  Patient denies unilateral numbness, tingling, weakness.     Past Medical History:  Diagnosis Date   Hypertension     There are no active problems to display for this patient.   Past Surgical History:  Procedure Laterality Date   ABDOMINAL HYSTERECTOMY     REDUCTION MAMMAPLASTY Bilateral      OB History   No obstetric history on file.      Home Medications    Prior to Admission medications   Medication Sig Start Date End Date Taking? Authorizing Provider  acetaminophen (TYLENOL) 500 MG tablet Take 1,000 mg by mouth every 6 (six) hours as needed for mild pain.   Yes [provider]  cholecalciferol (VITAMIN D3) 25 MCG (1000 UT) tablet Take 1,000 Units by mouth daily.   Yes [provider]  estradiol (VIVELLE-DOT) 0.075 MG/24HR Place 1 patch onto the skin every 3 (three) days. 02/15/19  Yes [provider]  ibuprofen (ADVIL) 200 MG tablet Take 400 mg by mouth every 6 (six) hours as needed for moderate pain.   Yes [provider]  Iron-Vitamins (GERITOL COMPLETE PO) Take 1 tablet by mouth daily.   Yes  [provider]  metoprolol succinate (TOPROL-XL) 100 MG 24 hr tablet Take 100 mg by mouth daily. 04/27/19  Yes [provider]  Multiple Vitamins-Minerals (ADULT GUMMY) CHEW Chew 1 each by mouth daily.   Yes [provider]  triamterene-hydrochlorothiazide (MAXZIDE) 75-50 MG tablet Take 1 tablet by mouth daily. 04/27/19  Yes [provider]  zolpidem (AMBIEN) 10 MG tablet Take 10 mg by mouth at bedtime as needed for sleep.  08/19/17  Yes [provider]  diclofenac sodium (VOLTAREN) 1 % GEL Apply 2 g topically 4 (four) times daily. Patient not taking: Reported on 07/12/2019 10/30/17   Leandrew Koyanagi, MD  meloxicam (MOBIC) 7.5 MG tablet Take 1 tablet (7.5 mg total) by mouth 2 (two) times daily as needed for pain. Patient not taking: Reported on 07/12/2019 10/30/17   Leandrew Koyanagi, MD  predniSONE (STERAPRED UNI-PAK 21 TAB) 10 MG (21) TBPK tablet Use as directed Patient not taking: Reported on 07/12/2019 01/20/17   Sharion Balloon, FNP    Family History History reviewed. No pertinent family history.  Social History Social History   Tobacco Use   Smoking status: Never Smoker   Smokeless tobacco: Never Used  Substance Use Topics   Alcohol use: Not on file   Drug use: Not on file     Allergies   Reglan [metoclopramide]   Review of Systems Review of Systems  Eyes:  Negative for visual disturbance.  Respiratory: Negative for shortness of breath.   Cardiovascular: Negative for chest pain.  Neurological: Positive for dizziness and headaches.  All other systems reviewed and are negative.    Physical Exam Updated Vital Signs BP (!) 156/92 (BP Location: Right Arm)    Pulse 68    Temp 98.4 F (36.9 C) (Oral)    Resp 15    Ht 5\' 7"  (1.702 m)    Wt 86.2 kg    SpO2 97%    BMI 29.76 kg/m   Physical Exam Vitals signs and nursing note reviewed.  Constitutional:      General: She is not in acute distress.    Appearance: Normal appearance. She is  well-developed.  HENT:     Head: Normocephalic and atraumatic.     Right Ear: Hearing normal.     Left Ear: Hearing normal.     Nose: Nose normal.  Eyes:     Conjunctiva/sclera: Conjunctivae normal.     Pupils: Pupils are equal, round, and reactive to light.  Neck:     Musculoskeletal: Normal range of motion and neck supple.  Cardiovascular:     Rate and Rhythm: Regular rhythm.     Heart sounds: S1 normal and S2 normal. No murmur. No friction rub. No gallop.   Pulmonary:     Effort: Pulmonary effort is normal. No respiratory distress.     Breath sounds: Normal breath sounds.  Chest:     Chest wall: No tenderness.  Abdominal:     General: Bowel sounds are normal.     Palpations: Abdomen is soft.     Tenderness: There is no abdominal tenderness. There is no guarding or rebound. Negative signs include Murphy's sign and McBurney's sign.     Hernia: No hernia is present.  Musculoskeletal: Normal range of motion.  Skin:    General: Skin is warm and dry.     Findings: No rash.  Neurological:     Mental Status: She is alert and oriented to person, place, and time.     GCS: GCS eye subscore is 4. GCS verbal subscore is 5. GCS motor subscore is 6.     Cranial Nerves: No cranial nerve deficit.     Sensory: No sensory deficit.     Coordination: Coordination normal.     Comments: Extraocular muscle movement: normal No visual field cut Pupils: equal and reactive both direct and consensual response is normal No nystagmus present    Sensory function is intact to light touch, pinprick Proprioception intact  Grip strength 5/5 symmetric in upper extremities No pronator drift Normal finger to nose bilaterally  Lower extremity strength 5/5 against gravity Normal heel to shin bilaterally  Gait: normal   Psychiatric:        Speech: Speech normal.        Behavior: Behavior normal.        Thought Content: Thought content normal.      ED Treatments / Results  Labs (all labs ordered  are listed, but only abnormal results are displayed) Labs Reviewed  CBC WITH DIFFERENTIAL/PLATELET - Abnormal; Notable for the following components:      Result Value   RBC 7.02 (*)    Hemoglobin 11.7 (*)    MCV 59.0 (*)    MCH 16.7 (*)    MCHC 28.3 (*)    RDW 19.1 (*)    All other components within normal limits  BASIC METABOLIC PANEL - Abnormal; Notable for the following  components:   Potassium 3.2 (*)    Glucose, Bld 124 (*)    All other components within normal limits    EKG EKG Interpretation  Date/Time:  Monday July 12 2019 23:46:05 EST Ventricular Rate:  61 PR Interval:    QRS Duration: 90 QT Interval:  405 QTC Calculation: 408 R Axis:   40 Text Interpretation: Sinus rhythm Atrial premature complex Borderline prolonged PR interval Minimal ST elevation, anterior leads No significant change since last tracing Confirmed by Gilda CreasePollina, Bentlee Drier J [phone]([id number]) on 07/13/2019 12:12:43 AM   Radiology Ct Head Wo Contrast  Result Date: 07/13/2019 CLINICAL DATA:  Headache, acute, normal neuro exam. Headache, onset today. Elevated blood pressure. EXAM: CT HEAD WITHOUT CONTRAST TECHNIQUE: Contiguous axial images were obtained from the base of the skull through the vertex without intravenous contrast. COMPARISON:  None. FINDINGS: Brain: No intracranial hemorrhage, mass effect, or midline shift. No hydrocephalus. The basilar cisterns are patent. No evidence of territorial infarct or acute ischemia. No extra-axial or intracranial fluid collection. Vascular: No hyperdense vessel or unexpected calcification. Skull: Normal. Negative for fracture or focal lesion. Sinuses/Orbits: Paranasal sinuses and mastoid air cells are clear. The visualized orbits are unremarkable. Other: None. IMPRESSION: Unremarkable noncontrast head CT. Electronically Signed   By: Narda RutherfordMelanie  Sanford M.D.   On: 07/13/2019 00:48    Procedures Procedures (including critical care time)  Medications Ordered in ED Medications    ketorolac (TORADOL) 30 MG/ML injection 15 mg (has no administration in time range)  hydrALAZINE (APRESOLINE) injection 10 mg (10 mg Intravenous Given 07/13/19 0008)     Initial Impression / Assessment and Plan / ED Course  I have reviewed the triage vital signs and the nursing notes.  Pertinent labs & imaging results that were available during my care of the patient were reviewed by me and considered in my medical decision making (see chart for details).        Patient presents to the emergency department for evaluation of headache and elevated blood pressure.  Patient is not experiencing any focal neurologic symptoms with her headache.  Headache was gradual in onset and has been in various areas of her head throughout the course of the day.  This is not concerning for subarachnoid hemorrhage.  CT head performed, unremarkable.  Additionally lab work was unremarkable.  Patient was very hypertensive at arrival.  She was given hydralazine and this is improved.  She still has some headache, was given Toradol for this and will be discharged, follow-up with her primary doctor for recheck of her blood pressure.  Continue her current blood pressure medications.  Final Clinical Impressions(s) / ED Diagnoses   Final diagnoses:  Essential hypertension    ED Discharge Orders    None       Ira Dougher, Canary Brimhristopher J, MD 07/13/19 [phone]

## 2019-07-16 DIAGNOSIS — Z09 Encounter for follow-up examination after completed treatment for conditions other than malignant neoplasm: Secondary | ICD-10-CM | POA: Diagnosis not present

## 2019-07-16 DIAGNOSIS — R6889 Other general symptoms and signs: Secondary | ICD-10-CM | POA: Diagnosis not present

## 2019-07-17 DIAGNOSIS — R6889 Other general symptoms and signs: Secondary | ICD-10-CM | POA: Diagnosis not present

## 2019-08-10 MED FILL — HYDROCODON-APAP 5-325: 5-325 | 3 days supply | Qty: 14 | Fill #0

## 2019-08-10 MED FILL — PENICILLIN VK 500 MG TABLET: 500 | 7 days supply | Qty: 28 | Fill #0

## 2019-08-11 MED FILL — ZOLPIDEM TARTRATE 10 MG TAB: 10 | 90 days supply | Qty: 90 | Fill #1

## 2019-11-08 MED FILL — ZOLPIDEM TARTRATE 10 MG TAB: 10 | 90 days supply | Qty: 90 | Fill #0

## 2019-11-08 MED FILL — ESTRADIOL 0.075 MG/24HR PTT: 0.075 | 84 days supply | Qty: 24 | Fill #0

## 2020-02-04 MED FILL — ZOLPIDEM TARTRATE 10 MG TAB: 10 | 90 days supply | Qty: 90 | Fill #1

## 2020-04-19 MED FILL — ESTRADIOL 0.075 MG/24HR PTT: 0.075 | 84 days supply | Qty: 24 | Fill #0

## 2020-05-04 MED FILL — ZOLPIDEM TARTRATE 10 MG TAB: 10 | 90 days supply | Qty: 90 | Fill #0

## 2020-05-30 ENCOUNTER — Other Ambulatory Visit (HOSPITAL_COMMUNITY): Payer: Self-pay | Admitting: Family Medicine

## 2020-05-30 DIAGNOSIS — F419 Anxiety disorder, unspecified: Secondary | ICD-10-CM | POA: Diagnosis not present

## 2020-05-30 DIAGNOSIS — N951 Menopausal and female climacteric states: Secondary | ICD-10-CM | POA: Diagnosis not present

## 2020-05-30 DIAGNOSIS — Z0000 Encounter for general adult medical examination without abnormal findings: Secondary | ICD-10-CM | POA: Diagnosis not present

## 2020-05-30 DIAGNOSIS — G4700 Insomnia, unspecified: Secondary | ICD-10-CM | POA: Diagnosis not present

## 2020-05-30 DIAGNOSIS — I10 Essential (primary) hypertension: Secondary | ICD-10-CM | POA: Diagnosis not present

## 2020-05-30 DIAGNOSIS — D569 Thalassemia, unspecified: Secondary | ICD-10-CM | POA: Diagnosis not present

## 2020-05-30 DIAGNOSIS — Z1211 Encounter for screening for malignant neoplasm of colon: Secondary | ICD-10-CM | POA: Diagnosis not present

## 2020-05-30 DIAGNOSIS — N939 Abnormal uterine and vaginal bleeding, unspecified: Secondary | ICD-10-CM | POA: Diagnosis not present

## 2020-05-30 DIAGNOSIS — R21 Rash and other nonspecific skin eruption: Secondary | ICD-10-CM | POA: Diagnosis not present

## 2020-05-30 MED FILL — TRIAMTERENE-HCTZ 75-50 MG T: 75-50 | 90 days supply | Qty: 90 | Fill #0

## 2020-05-30 MED FILL — METOPROLOL SUCCINATE ER 100: 100 | 90 days supply | Qty: 90 | Fill #0

## 2020-05-30 MED FILL — ALPRAZolam 0.5 MG TABS: 0.5 | 30 days supply | Qty: 30 | Fill #0

## 2020-05-30 MED FILL — CLOBETASOL PROPIONATE 0.05: 0.05 | 30 days supply | Qty: 30 | Fill #0

## 2020-07-26 ENCOUNTER — Other Ambulatory Visit (HOSPITAL_COMMUNITY): Payer: Self-pay | Admitting: Family Medicine

## 2020-07-26 MED FILL — ESZOPICLONE 3 MG TABS: 3 | 30 days supply | Qty: 30 | Fill #0

## 2020-08-07 ENCOUNTER — Other Ambulatory Visit (HOSPITAL_COMMUNITY): Payer: Self-pay | Admitting: Family Medicine

## 2020-08-07 MED FILL — ZOLPIDEM TART ER 6.25 MG TA: 6.25 | 30 days supply | Qty: 30 | Fill #0

## 2020-08-30 ENCOUNTER — Other Ambulatory Visit (HOSPITAL_COMMUNITY): Payer: Self-pay | Admitting: Family Medicine

## 2020-09-05 MED FILL — ZOLPIDEM TART ER 6.25 MG TA: 6.25 | 90 days supply | Qty: 90 | Fill #0

## 2020-10-03 ENCOUNTER — Other Ambulatory Visit (HOSPITAL_COMMUNITY): Payer: Self-pay | Admitting: Orthopaedic Surgery

## 2020-10-03 ENCOUNTER — Ambulatory Visit: Payer: 59 | Admitting: Orthopaedic Surgery

## 2020-10-03 ENCOUNTER — Encounter: Payer: Self-pay | Admitting: Orthopaedic Surgery

## 2020-10-03 ENCOUNTER — Ambulatory Visit (INDEPENDENT_AMBULATORY_CARE_PROVIDER_SITE_OTHER): Payer: 59

## 2020-10-03 DIAGNOSIS — M25562 Pain in left knee: Secondary | ICD-10-CM | POA: Diagnosis not present

## 2020-10-03 MED ORDER — DICLOFENAC SODIUM 75 MG PO TBEC: 75.0000 mg | DELAYED_RELEASE_TABLET | Freq: Two times a day (BID) | ORAL | 2 refills | Status: DC

## 2020-10-03 MED FILL — DICLOFENAC SOD EC 75 MG TAB: 75 | 15 days supply | Qty: 30 | Fill #0

## 2020-10-03 NOTE — Progress Notes (Signed)
Office Visit Note   Patient: Heather Diaz           Date of Birth: 02-24-1961           MRN: [id number] Visit Date: 10/03/2020              Requested by: Laurann Montana, MD [phone] [address] [place],  [place] [address] PCP: Laurann Montana, MD   Assessment & Plan: Visit Diagnoses:  1. Acute pain of left knee     Plan: Impression is acute left knee pain.  Questionable meniscus tear.  Cortisone injection was offered patient would like to try NSAIDs for another couple weeks as well as rest and ice.  She will return if she does not feel any better.  Follow-Up Instructions: Return if symptoms worsen or fail to improve.   Orders:  Orders Placed This Encounter  Procedures  . XR KNEE 3 VIEW LEFT   Meds ordered this encounter  Medications  . diclofenac (VOLTAREN) 75 MG EC tablet    Sig: Take 1 tablet (75 mg total) by mouth 2 (two) times daily.    Dispense:  30 tablet    Refill:  2      Procedures: No procedures performed   Clinical Data: No additional findings.   Subjective: Chief Complaint  Patient presents with  . Left Knee - Pain    Makella is a here for evaluation of acute left knee pain from this weekend.  She turned the wrong way and felt a pop in her knee.  Originally was swollen on Saturday but has improved.  She took some Mobic which made her nauseous.  She has some catching pain on the front of her knee over the proximal tibia.  She has been icing it.  Denies any severe pain.   Review of Systems  Constitutional: Negative.   HENT: Negative.   Eyes: Negative.   Respiratory: Negative.   Cardiovascular: Negative.   Endocrine: Negative.   Musculoskeletal: Negative.   Neurological: Negative.   Hematological: Negative.   Psychiatric/Behavioral: Negative.   All other systems reviewed and are negative.    Objective: Vital Signs: There were no vitals taken for this visit.  Physical Exam Vitals and nursing note reviewed.  Constitutional:       Appearance: She is well-developed and well-nourished.  Pulmonary:     Effort: Pulmonary effort is normal.  Skin:    General: Skin is warm.     Capillary Refill: Capillary refill takes less than 2 seconds.  Neurological:     Mental Status: She is alert and oriented to person, place, and time.  Psychiatric:        Mood and Affect: Mood and affect normal.        Behavior: Behavior normal.        Thought Content: Thought content normal.        Judgment: Judgment normal.     Ortho Exam Left knee shows trace effusion.  Slight medial joint line tenderness causing cruciates are stable.  Negative McMurray.  No pain with range of motion. Specialty Comments:  No specialty comments available.  Imaging: XR KNEE 3 VIEW LEFT  Result Date: 10/03/2020 No acute or structural abnormalities    PMFS History: There are no problems to display for this patient.  Past Medical History:  Diagnosis Date  . Hypertension     History reviewed. No pertinent family history.  Past Surgical History:  Procedure Laterality Date  . ABDOMINAL HYSTERECTOMY    .  REDUCTION MAMMAPLASTY Bilateral    Social History   Occupational History  . Not on file  Tobacco Use  . Smoking status: Never Smoker  . Smokeless tobacco: Never Used  Substance and Sexual Activity  . Alcohol use: Not on file  . Drug use: Not on file  . Sexual activity: Not on file

## 2020-10-04 ENCOUNTER — Telehealth: Payer: Self-pay | Admitting: Orthopaedic Surgery

## 2020-10-04 ENCOUNTER — Ambulatory Visit: Payer: 59 | Admitting: Orthopaedic Surgery

## 2020-10-04 ENCOUNTER — Other Ambulatory Visit: Payer: Self-pay

## 2020-10-04 DIAGNOSIS — M25562 Pain in left knee: Secondary | ICD-10-CM

## 2020-10-04 NOTE — Telephone Encounter (Signed)
Patient called. She would like to have a MRI done. Her call back number is [phone]

## 2020-10-04 NOTE — Telephone Encounter (Signed)
Okay we can order.

## 2020-10-04 NOTE — Telephone Encounter (Signed)
Order made. Someone will call her to schedule appt. F/u after MRI.

## 2020-10-04 NOTE — Telephone Encounter (Signed)
Please see msg for xry request. Thank you!

## 2020-10-04 NOTE — Telephone Encounter (Signed)
Pt called stating she had an X-Ray done and would like a copy of it; she would like to know if she can sign a release form and pick it up by 10/05/20? Pt also mentioned having a referral for an MRI she would like that submitted so she can call the hospital and get it scheduled while she still has off from work. Pt would like a CB when the X-Ray is ready for pick up as well as a CB when the referral has been submitted.

## 2020-10-04 NOTE — Telephone Encounter (Signed)
Called and advised patient CD was ready for pickup at front desk.  

## 2020-10-05 ENCOUNTER — Other Ambulatory Visit: Payer: Self-pay

## 2020-10-05 ENCOUNTER — Ambulatory Visit (HOSPITAL_COMMUNITY)
Admission: RE | Admit: 2020-10-05 | Discharge: 2020-10-05 | Disposition: A | Discharge status: Home / Self Care | Payer: 59 | Source: Ambulatory Visit | Attending: Orthopaedic Surgery | Admitting: Orthopaedic Surgery

## 2020-10-05 DIAGNOSIS — M25562 Pain in left knee: Secondary | ICD-10-CM | POA: Diagnosis not present

## 2020-10-08 ENCOUNTER — Ambulatory Visit (HOSPITAL_COMMUNITY): Admission: RE | Admit: 2020-10-08 | Payer: 59 | Source: Ambulatory Visit

## 2020-10-12 ENCOUNTER — Encounter: Payer: Self-pay | Admitting: Orthopaedic Surgery

## 2020-10-12 ENCOUNTER — Ambulatory Visit: Payer: 59 | Admitting: Orthopaedic Surgery

## 2020-10-12 VITALS — Ht 67.0 in | Wt 190.0 lb

## 2020-10-12 DIAGNOSIS — G8929 Other chronic pain: Secondary | ICD-10-CM | POA: Diagnosis not present

## 2020-10-12 DIAGNOSIS — M25562 Pain in left knee: Secondary | ICD-10-CM | POA: Diagnosis not present

## 2020-10-12 MED ORDER — LIDOCAINE HCL 1 % IJ SOLN
2.0000 mL | INTRAMUSCULAR | Status: AC | PRN
  Administered 2020-10-12: 2 mL

## 2020-10-12 MED ORDER — METHYLPREDNISOLONE ACETATE 40 MG/ML IJ SUSP
40.0000 mg | INTRAMUSCULAR | Status: AC | PRN
  Administered 2020-10-12: 40 mg via INTRA_ARTICULAR

## 2020-10-12 MED ORDER — BUPIVACAINE HCL 0.5 % IJ SOLN
2.0000 mL | INTRAMUSCULAR | Status: AC | PRN
  Administered 2020-10-12: 2 mL via INTRA_ARTICULAR

## 2020-10-12 NOTE — Progress Notes (Signed)
   Office Visit Note   Patient: Heather Diaz           Date of Birth: [birth year]           MRN: [id number] Visit Date: 10/12/2020              Requested by: Laurann Montana, MD [phone] [address] [place],  [place] [address] PCP: Laurann Montana, MD   Assessment & Plan: Visit Diagnoses:  1. 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  These findings were reviewed with the patient in detail.  Recommendation injection which she tolerated well today.  She will follow up if she notices no improvement from this injection.  Follow-Up Instructions: Return if symptoms worsen or fail to improve.   Orders:  No orders of the defined types were placed in this encounter.  No orders of the defined types were placed in this encounter.     Procedures: Large Joint Inj: L knee on 10/12/2020 9:24 AM Details: 22 G needle Medications: 2 mL bupivacaine 0.5 %; 2 mL lidocaine 1 %; 40 mg methylPREDNISolone acetate 40 MG/ML Outcome: tolerated well, no immediate complications Patient was prepped and draped in the usual sterile fashion.       Clinical Data: No additional findings.   Subjective: Chief Complaint  Patient presents with  . Left Knee - Follow-up    MRI review    Eliannah returns today for MRI review of the left knee.  No changes in symptoms.  Continues to have popping and pain on the lateral side.  No medial side symptoms or mechanical symptoms.   Review of Systems   Objective: Vital Signs: Ht 5\' 7"  (1.702 m)   Wt 190 lb (86.2 kg)   BMI 29.76 kg/m   Physical Exam  Ortho Exam Left knee shows trace effusion.  No significant joint line tenderness.  Pain with range of motion. Specialty Comments:  No specialty comments available.  Imaging: No results found.   PMFS History: There are no  problems to display for this patient.  Past Medical History:  Diagnosis Date  . Hypertension     History reviewed. No pertinent family history.  Past Surgical History:  Procedure Laterality Date  . ABDOMINAL HYSTERECTOMY    . REDUCTION MAMMAPLASTY Bilateral    Social History   Occupational History  . Not on file  Tobacco Use  . Smoking status: Never Smoker  . Smokeless tobacco: Never Used  Substance and Sexual Activity  . Alcohol use: Not on file  . Drug use: Not on file  . Sexual activity: Not on file

## 2020-10-21 ENCOUNTER — Encounter: Payer: Self-pay | Admitting: Orthopaedic Surgery

## 2020-10-23 ENCOUNTER — Other Ambulatory Visit: Payer: Self-pay | Admitting: Orthopaedic Surgery

## 2020-10-23 DIAGNOSIS — L255 Unspecified contact dermatitis due to plants, except food: Secondary | ICD-10-CM

## 2020-10-23 MED ORDER — PREDNISONE 10 MG (21) PO TBPK: ORAL_TABLET | ORAL | 0 refills | Status: DC

## 2020-10-23 MED FILL — predniSONE 10 MG (21) TBPK: 10 | 6 days supply | Qty: 21 | Fill #0

## 2020-11-27 ENCOUNTER — Other Ambulatory Visit (HOSPITAL_COMMUNITY): Payer: Self-pay

## 2020-11-27 MED ORDER — ZOLPIDEM TARTRATE 10 MG PO TABS
ORAL_TABLET | ORAL | 0 refills | Status: DC
  Filled 2020-12-15: qty 90, 90d supply, fill #0

## 2020-11-27 MED ORDER — ZOLPIDEM TARTRATE 5 MG PO TABS
ORAL_TABLET | ORAL | 0 refills | Status: DC
  Filled 2020-11-27: qty 30, 30d supply, fill #0

## 2020-12-06 ENCOUNTER — Encounter: Payer: Self-pay | Admitting: Orthopaedic Surgery

## 2020-12-07 ENCOUNTER — Telehealth: Payer: Self-pay | Admitting: Orthopaedic Surgery

## 2020-12-07 NOTE — Telephone Encounter (Signed)
I called patient and she told me that the knee pain is worse and that she's experiencing more frequent mechanical symptoms of giving way, clicking, catching.  She feels that the pain is almost completely medial.  Given worsening symptoms despite conservative treatment, I have recommended arthroscopic PMM after a thorough review of the MRI.  All questions answered.  My office will be in touch to schedule surgery.

## 2020-12-09 ENCOUNTER — Other Ambulatory Visit (HOSPITAL_COMMUNITY): Payer: Self-pay

## 2020-12-09 MED ORDER — ESTRADIOL 0.075 MG/24HR TD PTTW
MEDICATED_PATCH | TRANSDERMAL | 0 refills | Status: DC
  Filled 2020-12-09: qty 24, 84d supply, fill #0

## 2020-12-09 MED FILL — Diclofenac Sodium Tab Delayed Release 75 MG: ORAL | 15 days supply | Qty: 30 | Fill #0 | Status: AC

## 2020-12-11 ENCOUNTER — Telehealth: Payer: Self-pay | Admitting: Orthopaedic Surgery

## 2020-12-11 NOTE — Telephone Encounter (Signed)
Yes to 5/11 and OOW for up to 4 weeks

## 2020-12-11 NOTE — Telephone Encounter (Signed)
Is it okay for her to be OOW starting 5/11? Also OOW until when?

## 2020-12-11 NOTE — Telephone Encounter (Signed)
Faxed to [phone] per patients request.

## 2020-12-11 NOTE — Telephone Encounter (Signed)
Patient is scheduled for left knee PMM at [facility] Day 12-22-20 at 12pm.  She is requesting to be taken out of work starting Wednesday, May 11th.  There are several days in which she is already off after the 11th, but with the increased discomfort and swelling it is too difficult to be on her feet right now at work.    Please call patient to let her know if you can agree to this.

## 2020-12-15 ENCOUNTER — Other Ambulatory Visit (HOSPITAL_COMMUNITY): Payer: Self-pay

## 2020-12-15 ENCOUNTER — Other Ambulatory Visit: Payer: Self-pay

## 2020-12-15 ENCOUNTER — Encounter (HOSPITAL_BASED_OUTPATIENT_CLINIC_OR_DEPARTMENT_OTHER): Payer: Self-pay | Admitting: Orthopaedic Surgery

## 2020-12-18 ENCOUNTER — Other Ambulatory Visit: Payer: Self-pay

## 2020-12-18 ENCOUNTER — Encounter (HOSPITAL_BASED_OUTPATIENT_CLINIC_OR_DEPARTMENT_OTHER)
Admission: RE | Admit: 2020-12-18 | Discharge: 2020-12-18 | Disposition: A | Discharge status: Home / Self Care | Payer: 59 | Source: Ambulatory Visit | Attending: Orthopaedic Surgery | Admitting: Orthopaedic Surgery

## 2020-12-18 DIAGNOSIS — Z01812 Encounter for preprocedural laboratory examination: Secondary | ICD-10-CM | POA: Diagnosis not present

## 2020-12-18 LAB — BASIC METABOLIC PANEL
Anion gap: 8 (ref 5–15)
BUN: 13 mg/dL (ref 8–23)
CO2: 24 mmol/L (ref 22–32)
Calcium: 9.2 mg/dL (ref 8.9–10.3)
Chloride: 109 mmol/L (ref 98–111)
Creatinine, Ser: 0.99 mg/dL (ref 0.44–1.00)
GFR, Estimated: >60 mL/min (ref >60)
Glucose, Bld: 112 mg/dL — ABNORMAL HIGH (ref 70–99)
Potassium: 3.9 mmol/L (ref 3.5–5.1)
Sodium: 141 mmol/L (ref 135–145)

## 2020-12-18 NOTE — Progress Notes (Signed)

## 2020-12-19 ENCOUNTER — Other Ambulatory Visit (HOSPITAL_COMMUNITY)
Admission: RE | Admit: 2020-12-19 | Discharge: 2020-12-19 | Disposition: A | Discharge status: Home / Self Care | Payer: 59 | Source: Ambulatory Visit | Attending: Orthopaedic Surgery | Admitting: Orthopaedic Surgery

## 2020-12-19 ENCOUNTER — Other Ambulatory Visit: Payer: Self-pay

## 2020-12-19 DIAGNOSIS — Z20822 Contact with and (suspected) exposure to covid-19: Secondary | ICD-10-CM | POA: Diagnosis not present

## 2020-12-19 DIAGNOSIS — Z01812 Encounter for preprocedural laboratory examination: Secondary | ICD-10-CM | POA: Insufficient documentation

## 2020-12-20 LAB — SARS CORONAVIRUS 2 (TAT 6-24 HRS): SARS Coronavirus 2: NEGATIVE

## 2020-12-21 ENCOUNTER — Telehealth: Payer: Self-pay | Admitting: Orthopaedic Surgery

## 2020-12-21 NOTE — Telephone Encounter (Signed)
Patient submitted medical release form, $25.00 cash. Still waiting for forms to come thru fax. Gave pt alternate fax number. Accepted 12/21/20

## 2020-12-22 ENCOUNTER — Encounter (HOSPITAL_BASED_OUTPATIENT_CLINIC_OR_DEPARTMENT_OTHER): Payer: Self-pay | Admitting: Orthopaedic Surgery

## 2020-12-22 ENCOUNTER — Other Ambulatory Visit (HOSPITAL_COMMUNITY): Payer: Self-pay

## 2020-12-22 ENCOUNTER — Encounter (HOSPITAL_BASED_OUTPATIENT_CLINIC_OR_DEPARTMENT_OTHER)
Admission: RE | Disposition: A | Discharge status: Home / Self Care | Payer: Self-pay | Source: Home / Self Care | Attending: Orthopaedic Surgery

## 2020-12-22 ENCOUNTER — Ambulatory Visit (HOSPITAL_BASED_OUTPATIENT_CLINIC_OR_DEPARTMENT_OTHER): Payer: 59 | Admitting: Certified Registered"

## 2020-12-22 ENCOUNTER — Encounter: Payer: Self-pay | Admitting: Orthopaedic Surgery

## 2020-12-22 ENCOUNTER — Other Ambulatory Visit: Payer: Self-pay

## 2020-12-22 ENCOUNTER — Ambulatory Visit (HOSPITAL_BASED_OUTPATIENT_CLINIC_OR_DEPARTMENT_OTHER)
Admission: RE | Admit: 2020-12-22 | Discharge: 2020-12-22 | Disposition: A | Discharge status: Home / Self Care | Payer: 59 | Attending: Orthopaedic Surgery | Admitting: Orthopaedic Surgery

## 2020-12-22 DIAGNOSIS — M94262 Chondromalacia, left knee: Secondary | ICD-10-CM | POA: Diagnosis not present

## 2020-12-22 DIAGNOSIS — S83282A Other tear of lateral meniscus, current injury, left knee, initial encounter: Secondary | ICD-10-CM | POA: Diagnosis not present

## 2020-12-22 DIAGNOSIS — S83232A Complex tear of medial meniscus, current injury, left knee, initial encounter: Secondary | ICD-10-CM | POA: Diagnosis not present

## 2020-12-22 DIAGNOSIS — X58XXXA Exposure to other specified factors, initial encounter: Secondary | ICD-10-CM | POA: Insufficient documentation

## 2020-12-22 DIAGNOSIS — I10 Essential (primary) hypertension: Secondary | ICD-10-CM | POA: Diagnosis not present

## 2020-12-22 DIAGNOSIS — M6752 Plica syndrome, left knee: Secondary | ICD-10-CM | POA: Diagnosis not present

## 2020-12-22 DIAGNOSIS — S83242A Other tear of medial meniscus, current injury, left knee, initial encounter: Secondary | ICD-10-CM | POA: Diagnosis present

## 2020-12-22 HISTORY — PX: KNEE ARTHROSCOPY WITH MEDIAL MENISECTOMY: SHX5651

## 2020-12-22 HISTORY — DX: Anemia, unspecified: D64.9

## 2020-12-22 SURGERY — ARTHROSCOPY, KNEE, WITH MEDIAL MENISCECTOMY
Anesthesia: General | Site: Knee | Laterality: Left

## 2020-12-22 MED ORDER — FENTANYL CITRATE (PF) 100 MCG/2ML IJ SOLN: 25.0000 ug | INTRAMUSCULAR | Status: DC | PRN

## 2020-12-22 MED ORDER — SODIUM CHLORIDE 0.9 % IR SOLN
Status: DC | PRN
  Administered 2020-12-22: 3000 mL

## 2020-12-22 MED ORDER — ONDANSETRON HCL 4 MG/2ML IJ SOLN
INTRAMUSCULAR | Status: DC | PRN
  Administered 2020-12-22: 4 mg via INTRAVENOUS

## 2020-12-22 MED ORDER — CEFAZOLIN SODIUM-DEXTROSE 2-4 GM/100ML-% IV SOLN
INTRAVENOUS | Status: AC
  Filled 2020-12-22: qty 100

## 2020-12-22 MED ORDER — HYDROCODONE-ACETAMINOPHEN 7.5-325 MG PO TABS
1.0000 | ORAL_TABLET | Freq: Two times a day (BID) | ORAL | 0 refills | Status: DC | PRN
  Filled 2020-12-22: qty 20, 5d supply, fill #0

## 2020-12-22 MED ORDER — CEFAZOLIN SODIUM-DEXTROSE 2-4 GM/100ML-% IV SOLN
2.0000 g | INTRAVENOUS | Status: AC
Start: 2020-12-22 — End: 2020-12-22
  Administered 2020-12-22: 2 g via INTRAVENOUS

## 2020-12-22 MED ORDER — MIDAZOLAM HCL 2 MG/2ML IJ SOLN
INTRAMUSCULAR | Status: AC
  Filled 2020-12-22: qty 2

## 2020-12-22 MED ORDER — FENTANYL CITRATE (PF) 100 MCG/2ML IJ SOLN
INTRAMUSCULAR | Status: AC
  Filled 2020-12-22: qty 2

## 2020-12-22 MED ORDER — BUPIVACAINE HCL 0.5 % IJ SOLN
INTRAMUSCULAR | Status: DC | PRN
  Administered 2020-12-22: 20 mL

## 2020-12-22 MED ORDER — ONDANSETRON HCL 4 MG/2ML IJ SOLN
INTRAMUSCULAR | Status: AC
  Filled 2020-12-22: qty 2

## 2020-12-22 MED ORDER — LIDOCAINE 2% (20 MG/ML) 5 ML SYRINGE
INTRAMUSCULAR | Status: AC
  Filled 2020-12-22: qty 5

## 2020-12-22 MED ORDER — PHENYLEPHRINE 40 MCG/ML (10ML) SYRINGE FOR IV PUSH (FOR BLOOD PRESSURE SUPPORT)
PREFILLED_SYRINGE | INTRAVENOUS | Status: AC
  Filled 2020-12-22: qty 10

## 2020-12-22 MED ORDER — LACTATED RINGERS IV SOLN: INTRAVENOUS | Status: DC

## 2020-12-22 MED ORDER — FENTANYL CITRATE (PF) 100 MCG/2ML IJ SOLN
25.0000 ug | INTRAMUSCULAR | Status: DC | PRN
  Administered 2020-12-22 (×2): 25 ug via INTRAVENOUS
  Administered 2020-12-22: 50 ug via INTRAVENOUS

## 2020-12-22 MED ORDER — MIDAZOLAM HCL 5 MG/5ML IJ SOLN
INTRAMUSCULAR | Status: DC | PRN
  Administered 2020-12-22: 2 mg via INTRAVENOUS

## 2020-12-22 MED ORDER — FENTANYL CITRATE (PF) 100 MCG/2ML IJ SOLN
INTRAMUSCULAR | Status: DC | PRN
  Administered 2020-12-22: 25 ug via INTRAVENOUS
  Administered 2020-12-22: 100 ug via INTRAVENOUS
  Administered 2020-12-22 (×3): 25 ug via INTRAVENOUS

## 2020-12-22 MED ORDER — PROPOFOL 10 MG/ML IV BOLUS
INTRAVENOUS | Status: DC | PRN
  Administered 2020-12-22: 200 mg via INTRAVENOUS

## 2020-12-22 MED ORDER — LIDOCAINE HCL (CARDIAC) PF 100 MG/5ML IV SOSY
PREFILLED_SYRINGE | INTRAVENOUS | Status: DC | PRN
  Administered 2020-12-22: 80 mg via INTRAVENOUS

## 2020-12-22 MED ORDER — DEXAMETHASONE SODIUM PHOSPHATE 4 MG/ML IJ SOLN
INTRAMUSCULAR | Status: DC | PRN
  Administered 2020-12-22: 5 mg via INTRAVENOUS

## 2020-12-22 SURGICAL SUPPLY — 31 items
BLADE EXCALIBUR 4.0X13 (MISCELLANEOUS) ×2 IMPLANT
BLADE SHAVER TORPEDO 4X13 (MISCELLANEOUS) IMPLANT
BNDG ELASTIC 6X5.8 VLCR STR LF (GAUZE/BANDAGES/DRESSINGS) ×4 IMPLANT
BURR OVAL 8 FLU 4.0X13 (MISCELLANEOUS) IMPLANT
COOLER ICEMAN CLASSIC (MISCELLANEOUS) ×2 IMPLANT
CUFF TOURN SGL QUICK 34 (TOURNIQUET CUFF) ×2
CUFF TRNQT CYL 34X4.125X (TOURNIQUET CUFF) ×1 IMPLANT
CUTTER BONE 4.0MM X 13CM (MISCELLANEOUS) IMPLANT
DRAPE ARTHROSCOPY W/POUCH 90 (DRAPES) ×2 IMPLANT
DRAPE IMP U-DRAPE 54X76 (DRAPES) ×2 IMPLANT
DRAPE U-SHAPE 47X51 STRL (DRAPES) ×2 IMPLANT
DURAPREP 26ML APPLICATOR (WOUND CARE) ×4 IMPLANT
EXCALIBUR 3.8MM X 13CM (MISCELLANEOUS) ×1 IMPLANT
GAUZE SPONGE 4X4 12PLY STRL (GAUZE/BANDAGES/DRESSINGS) ×2 IMPLANT
GAUZE XEROFORM 1X8 LF (GAUZE/BANDAGES/DRESSINGS) ×2 IMPLANT
GLOVE SURG LTX SZ7 (GLOVE) ×2 IMPLANT
GLOVE SURG SYN 7.5  E (GLOVE) ×2
GLOVE SURG SYN 7.5 E (GLOVE) ×1 IMPLANT
GLOVE SURG SYN 7.5 PF PI (GLOVE) ×1 IMPLANT
GLOVE SURG UNDER POLY LF SZ7 (GLOVE) ×4 IMPLANT
GOWN STRL REIN XL XLG (GOWN DISPOSABLE) ×4 IMPLANT
GOWN STRL REUS W/ TWL LRG LVL3 (GOWN DISPOSABLE) ×1 IMPLANT
GOWN STRL REUS W/TWL LRG LVL3 (GOWN DISPOSABLE) ×2
MANIFOLD NEPTUNE II (INSTRUMENTS) ×2 IMPLANT
PACK ARTHROSCOPY DSU (CUSTOM PROCEDURE TRAY) ×2 IMPLANT
PACK BASIN DAY SURGERY FS (CUSTOM PROCEDURE TRAY) ×2 IMPLANT
PAD COLD SHLDR WRAP-ON (PAD) ×2 IMPLANT
SHEET MEDIUM DRAPE 40X70 STRL (DRAPES) ×2 IMPLANT
SUT ETHILON 3 0 PS 1 (SUTURE) ×2 IMPLANT
TOWEL GREEN STERILE FF (TOWEL DISPOSABLE) ×2 IMPLANT
TUBING ARTHROSCOPY IRRIG 16FT (MISCELLANEOUS) ×2 IMPLANT

## 2020-12-22 NOTE — Transfer of Care (Signed)
Immediate Anesthesia Transfer of Care Note  Patient: Heather Diaz  Procedure(s) Performed: LEFT KNEE ARTHROSCOPY WITH PARTIAL MEDIAL MENISECTOMY (Left Knee)  Patient Location: PACU  Anesthesia Type:General  Level of Consciousness: sedated  Airway & Oxygen Therapy: Patient Spontanous Breathing and Patient connected to face mask oxygen  Post-op Assessment: Report given to RN and Post -op Vital signs reviewed and stable  Post vital signs: Reviewed and stable  Last Vitals:  Vitals Value Taken Time  BP    Temp    Pulse    Resp    SpO2      Last Pain:  Vitals:   12/22/20 1001  PainSc: 2       Patients Stated Pain Goal: 5 (12/22/20 1001)  Complications: No complications documented.

## 2020-12-22 NOTE — H&P (Signed)
PREOPERATIVE H&P  Chief Complaint: left knee medial meniscal tear  HPI: Heather Diaz is a 60 y.o. female who presents for surgical treatment of left knee medial meniscal tear.  She denies any changes in medical history.  Past Medical History:  Diagnosis Date  . Anemia    thalacemia  . Hypertension    Past Surgical History:  Procedure Laterality Date  . ABDOMINAL HYSTERECTOMY    . REDUCTION MAMMAPLASTY Bilateral    Social History   Socioeconomic History  . Marital status: Divorced    Spouse name: Not on file  . Number of children: Not on file  . Years of education: Not on file  . Highest education level: Not on file  Occupational History  . Not on file  Tobacco Use  . Smoking status: Never Smoker  . Smokeless tobacco: Never Used  Substance and Sexual Activity  . Alcohol use: Not Currently  . Drug use: Never  . Sexual activity: Not Currently    Birth control/protection: Surgical  Other Topics Concern  . Not on file  Social History Narrative  . Not on file   Social Determinants of Health   Financial Resource Strain: Not on file  Food Insecurity: Not on file  Transportation Needs: Not on file  Physical Activity: Not on file  Stress: Not on file  Social Connections: Not on file   History reviewed. No pertinent family history. Allergies  Allergen Reactions  . Reglan [Metoclopramide]     confusion   Prior to Admission medications   Medication Sig Start Date End Date Taking? Authorizing Provider  cholecalciferol (VITAMIN D3) 25 MCG (1000 UT) tablet Take 1,000 Units by mouth daily.   Yes [provider]  diclofenac (VOLTAREN) 75 MG EC tablet Take 1 tablet (75 mg total) by mouth 2 (two) times daily. 10/03/20  Yes Tarry Kos, MD  Iron-Vitamins (GERITOL COMPLETE PO) Take 1 tablet by mouth daily.   Yes [provider]  metoprolol succinate (TOPROL-XL) 100 MG 24 hr tablet Take 100 mg by mouth daily. 04/27/19  Yes [provider]   Multiple Vitamins-Minerals (ADULT GUMMY) CHEW Chew 1 each by mouth daily.   Yes [provider]  triamterene-hydrochlorothiazide (MAXZIDE) 75-50 MG tablet TAKE 1 TABLET BY MOUTH EVERY MORNING 05/30/20 05/30/21 Yes Laurann Montana, MD  zolpidem (AMBIEN) 10 MG tablet Take 1/2 to 1 tablet by mouth at bedtime as needed  **CAN FILL 12/14/20 12/14/20  Yes   diclofenac (VOLTAREN) 75 MG EC tablet TAKE 1 TABLET BY MOUTH 2 TIMES DAILY. 10/03/20 10/03/21  Tarry Kos, MD  estradiol (VIVELLE-DOT) 0.075 MG/24HR Place 1 patch onto the skin every 3 (three) days. 02/15/19   [provider]  estradiol (VIVELLE-DOT) 0.075 MG/24HR apply 1 patch onto the skin twice a week 04/19/20   Laurann Montana, MD  metoprolol succinate (TOPROL-XL) 100 MG 24 hr tablet TAKE 1 TABLET BY MOUTH ONCE A DAY 05/30/20 05/30/21  Laurann Montana, MD  zolpidem (AMBIEN CR) 6.25 MG CR tablet TAKE 1 TABLET BY MOUTH AT BEDTIME 08/30/20 02/26/21  Laurann Montana, MD     Positive ROS: All other systems have been reviewed and were otherwise negative with the exception of those mentioned in the HPI and as above.  Physical Exam: General: Alert, no acute distress Cardiovascular: No pedal edema Respiratory: No cyanosis, no use of accessory musculature GI: abdomen soft Skin: No lesions in the area of chief complaint Neurologic: Sensation intact distally Psychiatric: Patient is competent for consent  with normal mood and affect Lymphatic: no lymphedema  MUSCULOSKELETAL: exam stable  Assessment: left knee medial meniscal tear  Plan: Plan for Procedure(s): LEFT KNEE ARTHROSCOPY WITH PARTIAL MEDIAL MENISECTOMY  The risks benefits and alternatives were discussed with the patient including but not limited to the risks of nonoperative treatment, versus surgical intervention including infection, bleeding, nerve injury,  blood clots, cardiopulmonary complications, morbidity, mortality, among others, and they were willing to proceed.    Preoperative templating of the joint replacement has been completed, documented, and submitted to the Operating Room personnel in order to optimize intra-operative equipment management.   Glee Arvin, MD 12/22/2020 9:44 AM

## 2020-12-22 NOTE — Op Note (Signed)
   Surgery Date: 12/22/2020  PREOPERATIVE DIAGNOSES:  1. Left knee medial and lateral meniscus tear 2. Left knee medial plica  POSTOPERATIVE DIAGNOSES:  same  PROCEDURES PERFORMED:  1. Left knee arthroscopy with limited synovectomy 2. Left knee arthroscopy with arthroscopic partial medial and lateral meniscectomy 3. Left knee arthroscopy with arthroscopic chondroplasty medial femoral condyle 4. Left knee arthroscopy with medial plica resection  SURGEON: N. Glee Arvin, M.D.  ASSIST: Starlyn Skeans [place], [place]; necessary for the timely completion of procedure and due to complexity of procedure.  ANESTHESIA:  general  FLUIDS: Per anesthesia record.   ESTIMATED BLOOD LOSS: minimal  DESCRIPTION OF PROCEDURE: Ms. Heather Diaz is a 60 y.o.-year-old female with above mentioned conditions. Full discussion held regarding risks benefits alternatives and complications related surgical intervention. Conservative care options reviewed. All questions answered.  The patient was identified in the preoperative holding area and the operative extremity was marked. The patient was brought to the operating room and transferred to operating table in a supine position. Satisfactory general anesthesia was induced by anesthesiology.    Standard anterolateral, anteromedial arthroscopy portals were obtained. The anteromedial portal was obtained with a spinal needle for localization under direct visualization with subsequent diagnostic findings.   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  Overall the cartilage was in good condition.  With a valgus stress to the knee we found the complex tear of the posterior horn the medial meniscus.  Partial medial meniscectomy was performed with meniscus basket oscillating shaver back to stable border.  We took about 35% of the volume of the meniscus.  The meniscal root was intact.  Cruciates were unremarkable.   Leg was then placed in figure 4 position to open the lateral compartment.  She has a small radial tear of the posterior horn which was debrided back to stable border.  The rest of the lateral meniscus showed some degenerative fraying.  Minimal chondromalacia of the lateral compartment.  The knee was then placed into full extension.  There was a small focal area of grade III chondromalacia near the sulcus of the femoral trochlea.  The patella exhibited minimal chondromalacia.  There was a fairly thickened medial plica that was resected.  Gutters were checked for loose bodies.  Excess fluid was removed from the knee joint.  Incisions were closed with interrupted nylon sutures.  Sterile dressings were applied.  Patient tolerated procedure well had no immediate complications.  Suprapatellar pouch and gutters: no synovitis or debris. Patella chondral surface: Grade 1 Trochlear chondral surface: Grade 3 Patellofemoral tracking: normal Medial meniscus: complex tear posterior horn.  Medial femoral condyle weight bearing surface: Grade 2 Medial tibial plateau: Grade 1 Anterior cruciate ligament:stable Posterior cruciate ligament:stable Lateral meniscus: small radial tear posterior horn.   Lateral femoral condyle weight bearing surface: Grade 1 Lateral tibial plateau: Grade 1  DISPOSITION: The patient was awakened from general anesthetic, extubated, taken to the recovery room in medically stable condition, no apparent complications. The patient may be weightbearing as tolerated to the operative lower extremity.  Range of motion of right knee as tolerated.  Mayra Reel, MD [facility] 1:00 PM

## 2020-12-22 NOTE — Anesthesia Preprocedure Evaluation (Addendum)
Anesthesia Evaluation  Patient identified by MRN, date of birth, ID band Patient awake    Reviewed: Allergy & Precautions, NPO status , Patient's Chart, lab work & pertinent test results  Airway Mallampati: II  TM Distance: >3 FB     Dental   Pulmonary neg pulmonary ROS,    breath sounds clear to auscultation       Cardiovascular hypertension,  Rhythm:Regular Rate:Normal     Neuro/Psych negative neurological ROS     GI/Hepatic negative GI ROS, Neg liver ROS,   Endo/Other  negative endocrine ROS  Renal/GU negative Renal ROS     Musculoskeletal   Abdominal   Peds  Hematology  (+) anemia ,   Anesthesia Other Findings   Reproductive/Obstetrics                             Anesthesia Physical Anesthesia Plan  ASA: III  Anesthesia Plan: General   Post-op Pain Management:    Induction: Intravenous  PONV Risk Score and Plan: 3  Airway Management Planned: LMA  Additional Equipment:   Intra-op Plan:   Post-operative Plan: Extubation in OR  Informed Consent: I have reviewed the patients History and Physical, chart, labs and discussed the procedure including the risks, benefits and alternatives for the proposed anesthesia with the patient or authorized representative who has indicated his/her understanding and acceptance.     Dental advisory given  Plan Discussed with: CRNA and Anesthesiologist  Anesthesia Plan Comments:         Anesthesia Quick Evaluation

## 2020-12-22 NOTE — Anesthesia Procedure Notes (Signed)
Procedure Name: LMA Insertion Performed by: Errik Mitchelle, Kerr, CRNA Pre-anesthesia Checklist: Patient identified, Emergency Drugs available, Suction available and Patient being monitored Patient Re-evaluated:Patient Re-evaluated prior to induction Oxygen Delivery Method: Circle system utilized Preoxygenation: Pre-oxygenation with 100% oxygen Induction Type: IV induction Ventilation: Mask ventilation without difficulty LMA: LMA inserted LMA Size: 4.0 Number of attempts: 1 Airway Equipment and Method: Bite block Placement Confirmation: positive ETCO2 Tube secured with: Tape Dental Injury: Teeth and Oropharynx as per pre-operative assessment        

## 2020-12-22 NOTE — Discharge Instructions (Signed)

## 2020-12-22 NOTE — Anesthesia Postprocedure Evaluation (Signed)
Anesthesia Post Note  Patient: [name]  Procedure(s) Performed: LEFT KNEE ARTHROSCOPY WITH PARTIAL MEDIAL MENISECTOMY (Left Knee)     Patient location during evaluation: PACU Anesthesia Type: General Level of consciousness: awake Pain management: pain level controlled Vital Signs Assessment: post-procedure vital signs reviewed and stable Respiratory status: spontaneous breathing Cardiovascular status: stable Postop Assessment: no apparent nausea or vomiting Anesthetic complications: no   No complications documented.  Last Vitals:  Vitals:   12/22/20 1400 12/22/20 1415  BP: (!) 166/70 (!) 163/82  Pulse: 62 64  Resp: 13 15  Temp:    SpO2: 97% 97%    Last Pain:  Vitals:   12/22/20 1415  PainSc: 0-No pain                 Valentino Saavedra

## 2020-12-25 ENCOUNTER — Encounter (HOSPITAL_BASED_OUTPATIENT_CLINIC_OR_DEPARTMENT_OTHER): Payer: Self-pay | Admitting: Orthopaedic Surgery

## 2020-12-29 ENCOUNTER — Ambulatory Visit (INDEPENDENT_AMBULATORY_CARE_PROVIDER_SITE_OTHER): Payer: 59 | Admitting: Physician Assistant

## 2020-12-29 ENCOUNTER — Encounter: Payer: Self-pay | Admitting: Orthopaedic Surgery

## 2020-12-29 ENCOUNTER — Other Ambulatory Visit: Payer: Self-pay

## 2020-12-29 VITALS — Ht 67.0 in | Wt 217.0 lb

## 2020-12-29 DIAGNOSIS — Z9889 Other specified postprocedural states: Secondary | ICD-10-CM

## 2020-12-29 NOTE — Progress Notes (Signed)
   Post-Op Visit Note   Patient: Heather Diaz           Date of Birth: 08-29-1960           MRN: [id number] Visit Date: 12/29/2020 PCP: Laurann Montana, MD   Assessment & Plan:  Chief Complaint:  Chief Complaint  Patient presents with  . Left Knee - Follow-up    Left knee arthroscopy with partial medial menisectomy 12/22/2020   Visit Diagnoses:  1. S/P arthroscopy of left knee     Plan: Patient is a pleasant 60 year old female who comes in today 1 week out left knee arthroscopic debridement medial lateral meniscus, chondroplasty and synovectomy.  She has been doing well.  She has mild pain with activity which is relieved with over-the-counter pain medicine.  She denies any pain at rest.  She has been working on range of motion exercises at home.  Examination of her left knee reveals well-healed surgical portals with nylon sutures in place.  No evidence of infection or cellulitis.  Calf is soft and nontender.  She is neurovascular intact distally.  Today, sutures were removed and Steri-Strips applied.  Intraoperative pictures reviewed.  I provided her with a home exercise program handout.  She will follow-up with [place] in 5 weeks time for recheck.  Call with concerns or questions in the meantime.  Follow-Up Instructions: Return in about 5 weeks (around 02/02/2021).   Orders:  No orders of the defined types were placed in this encounter.  No orders of the defined types were placed in this encounter.   Imaging: No new imaging  PMFS History: Patient Active Problem List   Diagnosis Date Noted  . Acute medial meniscus tear of left knee 12/22/2020  . Acute tear lateral meniscus, left, initial encounter 12/22/2020   Past Medical History:  Diagnosis Date  . Anemia    thalacemia  . Hypertension     History reviewed. No pertinent family history.  Past Surgical History:  Procedure Laterality Date  . ABDOMINAL HYSTERECTOMY    . KNEE ARTHROSCOPY WITH MEDIAL MENISECTOMY Left 12/22/2020    Procedure: LEFT KNEE ARTHROSCOPY WITH PARTIAL MEDIAL MENISECTOMY;  Surgeon: Tarry Kos, MD;  Location: [facility];  Service: Orthopedics;  Laterality: Left;  . REDUCTION MAMMAPLASTY Bilateral    Social History   Occupational History  . Not on file  Tobacco Use  . Smoking status: Never Smoker  . Smokeless tobacco: Never Used  Substance and Sexual Activity  . Alcohol use: Yes    Comment: occassional  . Drug use: Never  . Sexual activity: Not Currently    Birth control/protection: Surgical

## 2021-01-02 ENCOUNTER — Encounter: Payer: Self-pay | Admitting: Orthopaedic Surgery

## 2021-01-02 NOTE — Telephone Encounter (Signed)
Not our patient send to xu thx

## 2021-01-03 ENCOUNTER — Telehealth: Payer: Self-pay

## 2021-01-03 NOTE — Telephone Encounter (Signed)
Patient called regarding previous work nate patient stated she needs an exact date to when she can return back to work patient is coming to get the note around 1:00 today call back:[phone]

## 2021-01-03 NOTE — Telephone Encounter (Signed)
Please advise 

## 2021-01-03 NOTE — Telephone Encounter (Signed)
Patient portal message sent to pt to ask when she wanted to return

## 2021-01-03 NOTE — Telephone Encounter (Signed)
Sure let's do OOW for 6 weeks.  Thanks.

## 2021-01-31 ENCOUNTER — Other Ambulatory Visit: Payer: Self-pay

## 2021-02-01 ENCOUNTER — Encounter: Payer: Self-pay | Admitting: Orthopaedic Surgery

## 2021-02-01 ENCOUNTER — Ambulatory Visit (INDEPENDENT_AMBULATORY_CARE_PROVIDER_SITE_OTHER): Payer: 59 | Admitting: Orthopaedic Surgery

## 2021-02-01 DIAGNOSIS — Z9889 Other specified postprocedural states: Secondary | ICD-10-CM

## 2021-02-01 DIAGNOSIS — S83242A Other tear of medial meniscus, current injury, left knee, initial encounter: Secondary | ICD-10-CM

## 2021-02-01 NOTE — Progress Notes (Signed)
   Post-Op Visit Note   Patient: Heather Diaz           Date of Birth: 06/09/1961           MRN: [id number] Visit Date: 02/01/2021 PCP: Laurann Montana, MD   Assessment & Plan:  Chief Complaint:  Chief Complaint  Patient presents with   Left Knee - Pain, Routine Post Op   Visit Diagnoses:  1. S/P arthroscopy of left knee   2. Acute medial meniscus tear of left knee, initial encounter     Plan: Heather Diaz is a 6-week status post left knee arthroscopy partial medial meniscectomy.  Overall she is improving and continues to do home exercises.  She has some increased discomfort at nighttime.  Takes occasional over-the-counter medication.  Overall no real complaints.  Surgical scars are fully healed.  Range of motion of the knee is nearly back to normal.  She lacks a little bit of quadricep strength.  No joint effusion.  Heather Diaz is making good progress towards being able to go back to work on July 11.  I think is fine from my standpoint for her to do that.  I do not think she will need any restrictions.  She will steadily increase her activity as tolerated.  She continues to do short walks around the neighborhood.  We will see her back as needed.  Follow-Up Instructions: Return if symptoms worsen or fail to improve.   Orders:  No orders of the defined types were placed in this encounter.  No orders of the defined types were placed in this encounter.   Imaging: No results found.  PMFS History: Patient Active Problem List   Diagnosis Date Noted   Acute medial meniscus tear of left knee 12/22/2020   Acute tear lateral meniscus, left, initial encounter 12/22/2020   Past Medical History:  Diagnosis Date   Anemia    thalacemia   Hypertension     History reviewed. No pertinent family history.  Past Surgical History:  Procedure Laterality Date   ABDOMINAL HYSTERECTOMY     KNEE ARTHROSCOPY WITH MEDIAL MENISECTOMY Left 12/22/2020   Procedure: LEFT KNEE ARTHROSCOPY WITH PARTIAL MEDIAL  MENISECTOMY;  Surgeon: Tarry Kos, MD;  Location: [facility];  Service: Orthopedics;  Laterality: Left;   REDUCTION MAMMAPLASTY Bilateral    Social History   Occupational History   Not on file  Tobacco Use   Smoking status: Never   Smokeless tobacco: Never  Substance and Sexual Activity   Alcohol use: Yes    Comment: occassional   Drug use: Never   Sexual activity: Not Currently    Birth control/protection: Surgical

## 2021-03-12 ENCOUNTER — Other Ambulatory Visit (HOSPITAL_COMMUNITY): Payer: Self-pay

## 2021-03-14 ENCOUNTER — Other Ambulatory Visit (HOSPITAL_COMMUNITY): Payer: Self-pay

## 2021-03-14 MED ORDER — ZOLPIDEM TARTRATE 10 MG PO TABS
ORAL_TABLET | ORAL | 0 refills | Status: DC
  Filled 2021-03-14: qty 90, 90d supply, fill #0

## 2021-03-14 MED ORDER — ZOLPIDEM TARTRATE ER 6.25 MG PO TBCR
EXTENDED_RELEASE_TABLET | ORAL | 1 refills | Status: DC
  Filled 2021-03-14: qty 90, 90d supply, fill #0

## 2021-03-14 MED ORDER — ZOSTER VAC RECOMB ADJUVANTED 50 MCG/0.5ML IM SUSR
INTRAMUSCULAR | 1 refills | Status: DC
  Filled 2021-03-14: qty 1, 1d supply, fill #0
  Filled 2022-03-11: qty 1, 1d supply, fill #1

## 2021-04-22 MED FILL — Diclofenac Sodium Tab Delayed Release 75 MG: ORAL | 15 days supply | Qty: 30 | Fill #1 | Status: CN

## 2021-04-23 ENCOUNTER — Other Ambulatory Visit (HOSPITAL_COMMUNITY): Payer: Self-pay

## 2021-04-23 MED FILL — Diclofenac Sodium Tab Delayed Release 75 MG: ORAL | 15 days supply | Qty: 30 | Fill #1 | Status: AC

## 2021-04-27 ENCOUNTER — Other Ambulatory Visit (HOSPITAL_BASED_OUTPATIENT_CLINIC_OR_DEPARTMENT_OTHER): Payer: Self-pay

## 2021-05-15 ENCOUNTER — Other Ambulatory Visit (HOSPITAL_COMMUNITY): Payer: Self-pay

## 2021-05-22 ENCOUNTER — Encounter: Payer: Self-pay | Admitting: Orthopaedic Surgery

## 2021-05-22 NOTE — Telephone Encounter (Signed)
Topical cream is voltaren (diclofenac) and it is over the counter

## 2021-06-04 ENCOUNTER — Other Ambulatory Visit (HOSPITAL_COMMUNITY): Payer: Self-pay

## 2021-06-04 DIAGNOSIS — G4700 Insomnia, unspecified: Secondary | ICD-10-CM | POA: Diagnosis not present

## 2021-06-04 DIAGNOSIS — I10 Essential (primary) hypertension: Secondary | ICD-10-CM | POA: Diagnosis not present

## 2021-06-04 DIAGNOSIS — N951 Menopausal and female climacteric states: Secondary | ICD-10-CM | POA: Diagnosis not present

## 2021-06-04 DIAGNOSIS — D569 Thalassemia, unspecified: Secondary | ICD-10-CM | POA: Diagnosis not present

## 2021-06-04 DIAGNOSIS — Z0000 Encounter for general adult medical examination without abnormal findings: Secondary | ICD-10-CM | POA: Diagnosis not present

## 2021-06-04 MED ORDER — ESTRADIOL 0.05 MG/24HR TD PTTW
MEDICATED_PATCH | TRANSDERMAL | 3 refills | Status: DC
  Filled 2021-06-04: qty 24, 84d supply, fill #0

## 2021-06-04 MED ORDER — ZOLPIDEM TARTRATE 10 MG PO TABS
ORAL_TABLET | ORAL | 1 refills | Status: DC
  Filled 2021-06-13: qty 90, 90d supply, fill #0
  Filled 2021-09-12: qty 90, 90d supply, fill #1

## 2021-06-04 MED ORDER — METOPROLOL SUCCINATE ER 100 MG PO TB24
ORAL_TABLET | ORAL | 3 refills | Status: DC
  Filled 2021-06-04: qty 90, 90d supply, fill #0

## 2021-06-04 MED ORDER — TRIAMTERENE-HCTZ 75-50 MG PO TABS
ORAL_TABLET | ORAL | 3 refills | Status: DC
  Filled 2021-06-04: qty 90, 90d supply, fill #0

## 2021-06-05 ENCOUNTER — Other Ambulatory Visit (HOSPITAL_COMMUNITY): Payer: Self-pay

## 2021-06-13 ENCOUNTER — Other Ambulatory Visit (HOSPITAL_COMMUNITY): Payer: Self-pay

## 2021-07-12 DIAGNOSIS — H524 Presbyopia: Secondary | ICD-10-CM | POA: Diagnosis not present

## 2021-07-12 DIAGNOSIS — H5213 Myopia, bilateral: Secondary | ICD-10-CM | POA: Diagnosis not present

## 2021-08-17 DIAGNOSIS — I10 Essential (primary) hypertension: Secondary | ICD-10-CM | POA: Diagnosis not present

## 2021-09-12 ENCOUNTER — Other Ambulatory Visit (HOSPITAL_COMMUNITY): Payer: Self-pay

## 2021-09-24 ENCOUNTER — Other Ambulatory Visit: Payer: Self-pay | Admitting: Physician Assistant

## 2021-09-24 ENCOUNTER — Telehealth: Payer: Self-pay | Admitting: Orthopaedic Surgery

## 2021-09-24 ENCOUNTER — Other Ambulatory Visit (HOSPITAL_COMMUNITY): Payer: Self-pay

## 2021-09-24 MED ORDER — DICLOFENAC SODIUM 75 MG PO TBEC
75.0000 mg | DELAYED_RELEASE_TABLET | Freq: Two times a day (BID) | ORAL | 2 refills | Status: DC
  Filled 2021-09-24: qty 30, 15d supply, fill #0
  Filled 2021-12-28: qty 30, 15d supply, fill #1
  Filled 2021-12-28: qty 30, 15d supply, fill #0
  Filled 2022-01-24: qty 30, 15d supply, fill #1

## 2021-09-24 NOTE — Telephone Encounter (Signed)
Pt called requesting a refill of diclofen. Pt states she need for when she has pain. She don't all the time. Please send to [facility] ED outpatient pharmacy. Pt phone number is [phone].

## 2021-09-24 NOTE — Telephone Encounter (Signed)
I called patient and advised. 

## 2021-09-24 NOTE — Telephone Encounter (Signed)
Sent in

## 2021-10-03 ENCOUNTER — Encounter: Payer: Self-pay | Admitting: Orthopaedic Surgery

## 2021-10-03 NOTE — Telephone Encounter (Signed)
She would need to come in to be evaluated and for xrays

## 2021-10-09 ENCOUNTER — Encounter: Payer: Self-pay | Admitting: Orthopaedic Surgery

## 2021-10-09 ENCOUNTER — Other Ambulatory Visit: Payer: Self-pay

## 2021-10-09 ENCOUNTER — Ambulatory Visit (INDEPENDENT_AMBULATORY_CARE_PROVIDER_SITE_OTHER): Payer: 59

## 2021-10-09 ENCOUNTER — Ambulatory Visit: Payer: 59 | Admitting: Orthopaedic Surgery

## 2021-10-09 VITALS — Ht 67.0 in | Wt 200.0 lb

## 2021-10-09 DIAGNOSIS — M25561 Pain in right knee: Secondary | ICD-10-CM

## 2021-10-09 NOTE — Progress Notes (Signed)
? ?Office Visit Note ?  ?Patient: Heather Diaz           ?Date of Birth: 16-Apr-1961           ?MRN: [id number] ?Visit Date: 10/09/2021 ?             ?Requested by: Laurann Montana, MD ?[phone] ?[address] ?[place],  [place] [address] ?PCP: Laurann Montana, MD ? ? ?Assessment & Plan: ?Visit Diagnoses:  ?1. Acute pain of right knee   ? ? ?Plan: Based on findings impression is right knee lateral meniscus tear.  X-rays show some mild lateral compartment joint space narrowing.  She is having a lot of mechanical symptoms.  We will need an MRI to completely assess for a lateral meniscus tear.  Follow-up after the MRI. ? ?Follow-Up Instructions: No follow-ups on file.  ? ?Orders:  ?Orders Placed This Encounter  ?Procedures  ? XR KNEE 3 VIEW RIGHT  ? ?No orders of the defined types were placed in this encounter. ? ? ? ? Procedures: ?No procedures performed ? ? ?Clinical Data: ?No additional findings. ? ? ?Subjective: ?Chief Complaint  ?Patient presents with  ? Right Knee - Pain  ? ? ?HPI ? ?Heather Diaz is a 61 year old female who comes in for acute right knee pain for about a month.  She had an injury in which she was getting up from a seated position and felt a loud pop on the lateral posterior aspect of the knee.  Since then she has had constant pain and catching and swelling.  She has a lot of difficulty walking and weightbearing. ? ? ?Review of Systems  ?Constitutional: Negative.   ?HENT: Negative.    ?Eyes: Negative.   ?Respiratory: Negative.    ?Cardiovascular: Negative.   ?Endocrine: Negative.   ?Musculoskeletal: Negative.   ?Neurological: Negative.   ?Hematological: Negative.   ?Psychiatric/Behavioral: Negative.    ?All other systems reviewed and are negative. ? ? ?Objective: ?Vital Signs: Ht 5\' 7"  (1.702 m)   Wt 200 lb (90.7 kg)   BMI 31.32 kg/m?  ? ?Physical Exam ?Vitals and nursing note reviewed.  ?Constitutional:   ?   Appearance: She is well-developed.  ?Pulmonary:  ?   Effort: Pulmonary effort is normal.   ?Skin: ?   General: Skin is warm.  ?   Capillary Refill: Capillary refill takes less than 2 seconds.  ?Neurological:  ?   Mental Status: She is alert and oriented to person, place, and time.  ?Psychiatric:     ?   Behavior: Behavior normal.     ?   Thought Content: Thought content normal.     ?   Judgment: Judgment normal.  ? ? ?Ortho Exam ? ?Examination of the right knee shows small joint effusion.  Lateral joint line tenderness with positive McMurray's sign.  Collaterals and cruciates are stable. ? ?Specialty Comments:  ?No specialty comments available. ? ?Imaging: ?XR KNEE 3 VIEW RIGHT ? ?Result Date: 10/09/2021 ?Mild to moderate lateral compartment joint space narrowing.  ? ? ?PMFS History: ?Patient Active Problem List  ? Diagnosis Date Noted  ? Acute medial meniscus tear of left knee 12/22/2020  ? Acute tear lateral meniscus, left, initial encounter 12/22/2020  ? ?Past Medical History:  ?Diagnosis Date  ? Anemia   ? thalacemia  ? Hypertension   ?  ?No family history on file.  ?Past Surgical History:  ?Procedure Laterality Date  ? ABDOMINAL HYSTERECTOMY    ? KNEE ARTHROSCOPY WITH MEDIAL MENISECTOMY  Left 12/22/2020  ? Procedure: LEFT KNEE ARTHROSCOPY WITH PARTIAL MEDIAL MENISECTOMY;  Surgeon: Tarry Kos, MD;  Location: [facility];  Service: Orthopedics;  Laterality: Left;  ? REDUCTION MAMMAPLASTY Bilateral   ? ?Social History  ? ?Occupational History  ? Not on file  ?Tobacco Use  ? Smoking status: Never  ? Smokeless tobacco: Never  ?Substance and Sexual Activity  ? Alcohol use: Yes  ?  Comment: occassional  ? Drug use: Never  ? Sexual activity: Not Currently  ?  Birth control/protection: Surgical  ? ? ? ? ? ? ?

## 2021-10-10 ENCOUNTER — Other Ambulatory Visit: Payer: Self-pay

## 2021-10-10 ENCOUNTER — Telehealth: Payer: Self-pay | Admitting: Orthopaedic Surgery

## 2021-10-10 DIAGNOSIS — M25561 Pain in right knee: Secondary | ICD-10-CM

## 2021-10-10 NOTE — Telephone Encounter (Signed)
Mri order made.  

## 2021-10-10 NOTE — Telephone Encounter (Signed)
Patient aware.

## 2021-10-10 NOTE — Telephone Encounter (Signed)
Pt called requesting a referral for right knee. Please call pt about this matter. Pt states Dr. Roda Shutters was suppose to sent this referral. Please call pt about this matter at [phone]. FYI pt called inquiring about a MRI referral pt did not give name and number. Called back and states referral was never sent. Looked pt up and did not see referral. Please call  ?

## 2021-10-14 ENCOUNTER — Ambulatory Visit
Admission: RE | Admit: 2021-10-14 | Discharge: 2021-10-14 | Disposition: A | Discharge status: Home / Self Care | Payer: 59 | Source: Ambulatory Visit | Attending: Orthopaedic Surgery | Admitting: Orthopaedic Surgery

## 2021-10-14 DIAGNOSIS — M7121 Synovial cyst of popliteal space [Baker], right knee: Secondary | ICD-10-CM | POA: Diagnosis not present

## 2021-10-14 DIAGNOSIS — M25561 Pain in right knee: Secondary | ICD-10-CM

## 2021-10-14 DIAGNOSIS — S83241A Other tear of medial meniscus, current injury, right knee, initial encounter: Secondary | ICD-10-CM | POA: Diagnosis not present

## 2021-10-15 NOTE — Progress Notes (Signed)
Needs appt this week.  Thanks.

## 2021-10-18 ENCOUNTER — Encounter: Payer: Self-pay | Admitting: Orthopaedic Surgery

## 2021-10-18 ENCOUNTER — Telehealth: Payer: Self-pay | Admitting: Orthopaedic Surgery

## 2021-10-18 ENCOUNTER — Ambulatory Visit: Payer: 59 | Admitting: Orthopaedic Surgery

## 2021-10-18 ENCOUNTER — Encounter (HOSPITAL_BASED_OUTPATIENT_CLINIC_OR_DEPARTMENT_OTHER): Payer: Self-pay | Admitting: Orthopaedic Surgery

## 2021-10-18 ENCOUNTER — Encounter (HOSPITAL_BASED_OUTPATIENT_CLINIC_OR_DEPARTMENT_OTHER)
Admission: RE | Admit: 2021-10-18 | Discharge: 2021-10-18 | Disposition: A | Discharge status: Home / Self Care | Payer: 59 | Source: Ambulatory Visit | Attending: Orthopaedic Surgery | Admitting: Orthopaedic Surgery

## 2021-10-18 ENCOUNTER — Other Ambulatory Visit: Payer: Self-pay

## 2021-10-18 DIAGNOSIS — S83241A Other tear of medial meniscus, current injury, right knee, initial encounter: Secondary | ICD-10-CM | POA: Diagnosis not present

## 2021-10-18 DIAGNOSIS — Z79899 Other long term (current) drug therapy: Secondary | ICD-10-CM | POA: Diagnosis not present

## 2021-10-18 DIAGNOSIS — Z01812 Encounter for preprocedural laboratory examination: Secondary | ICD-10-CM | POA: Diagnosis not present

## 2021-10-18 LAB — BASIC METABOLIC PANEL
Anion gap: 11 (ref 5–15)
BUN: 21 mg/dL (ref 8–23)
CO2: 22 mmol/L (ref 22–32)
Calcium: 9.1 mg/dL (ref 8.9–10.3)
Chloride: 106 mmol/L (ref 98–111)
Creatinine, Ser: 0.62 mg/dL (ref 0.44–1.00)
GFR, Estimated: >60 mL/min (ref >60)
Glucose, Bld: 168 mg/dL — ABNORMAL HIGH (ref 70–99)
Potassium: 4.2 mmol/L (ref 3.5–5.1)
Sodium: 139 mmol/L (ref 135–145)

## 2021-10-18 NOTE — Telephone Encounter (Signed)
Yes that's fine for OOW 3 months

## 2021-10-18 NOTE — Telephone Encounter (Signed)
Patient requests out of work date start 10-22-21. Right knee PMM, PLM is scheduled with Dr. Roda Shutters at [facility] on 10-24-21.  Patient anticipates being out of work for 3 months following surgery.  Patient does have concerns related to paper work needing to be completed for her FMLA and started as soon as possible because her last surgery the patient states benefits were delayed because paperwork was held up.  She would like to avoid this same problem.  Please relay to [employer] or party handling forms the out of work date at 10-22-21 if Dr. Roda Shutters is ok with this.  If there are any questions regarding this request, please contact patient [phone].     ?

## 2021-10-18 NOTE — Progress Notes (Signed)

## 2021-10-18 NOTE — Progress Notes (Addendum)
? ?  Office Visit Note ?  ?Patient: Heather Diaz           ?Date of Birth: Feb 27, 1961           ?MRN: [id number] ?Visit Date: 10/18/2021 ?             ?Requested by: Laurann Montana, MD ?[phone] ?[address] ?[place],  [place] [address] ?PCP: Laurann Montana, MD ? ? ?Assessment & Plan: ?Visit Diagnoses:  ?1. Acute medial meniscus tear of right knee, initial encounter   ?2. Acute lateral meniscus tear of right knee, initial encounter   ? ? ?Plan: Iviona is here to discuss right knee MRI results.  Continues to have the same symptoms. ? ?Examination of the right knee is unchanged.  She has medial and lateral joint line tenderness and pain with range of motion and weightbearing. ? ?MRI of the right knee shows complex tear of the medial meniscus and lateral meniscus.  The lateral meniscus tear is flipped into the lateral gutter.  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  These findings were reviewed with the patient in detail and treatment options were reviewed and they are associated pros and cons and she has elected to move forward with right knee arthroscopy and partial medial and lateral meniscectomy and chondroplasty as indicated.  I anticipate that she will need to be out of work for 3 months and if she is able to go back to work sooner we will adjust it.  She was out of work for 3 months with her other knee surgery.  Questions encouraged and answered.  We will get her on the schedule ASAP. ? ?Follow-Up Instructions: No follow-ups on file.  ? ?Orders:  ?No orders of the defined types were placed in this encounter. ? ?No orders of the defined types were placed in this encounter. ? ? ? ? Procedures: ?No procedures performed ? ? ?Clinical Data: ?No additional findings. ? ? ?Subjective: ?Chief Complaint  ?Patient presents with  ? Right Knee - Pain  ? ? ?HPI ? ?Review of Systems ? ? ?Objective: ?Vital Signs: There were no vitals taken for this visit. ? ?Physical Exam ? ?Ortho  Exam ? ?Specialty Comments:  ?No specialty comments available. ? ?Imaging: ?No results found. ? ? ?PMFS History: ?Patient Active Problem List  ? Diagnosis Date Noted  ? Acute medial meniscus tear of right knee 10/18/2021  ? Acute lateral meniscus tear of right knee 10/18/2021  ? Acute medial meniscus tear of left knee 12/22/2020  ? Acute tear lateral meniscus, left, initial encounter 12/22/2020  ? ?Past Medical History:  ?Diagnosis Date  ? Anemia   ? thalacemia  ? Hypertension   ?  ?History reviewed. No pertinent family history.  ?Past Surgical History:  ?Procedure Laterality Date  ? ABDOMINAL HYSTERECTOMY    ? KNEE ARTHROSCOPY WITH MEDIAL MENISECTOMY Left 12/22/2020  ? Procedure: LEFT KNEE ARTHROSCOPY WITH PARTIAL MEDIAL MENISECTOMY;  Surgeon: Tarry Kos, MD;  Location: [facility];  Service: Orthopedics;  Laterality: Left;  ? REDUCTION MAMMAPLASTY Bilateral   ? ?Social History  ? ?Occupational History  ? Not on file  ?Tobacco Use  ? Smoking status: Never  ? Smokeless tobacco: Never  ?Substance and Sexual Activity  ? Alcohol use: Yes  ?  Comment: occassional  ? Drug use: Never  ? Sexual activity: Not Currently  ?  Birth control/protection: Surgical  ? ? ? ? ? ? ?

## 2021-10-19 NOTE — Telephone Encounter (Signed)
Letter/note made. Can you let [employer] know. ?Thank you. ? ?

## 2021-10-21 ENCOUNTER — Other Ambulatory Visit: Payer: 59

## 2021-10-24 ENCOUNTER — Other Ambulatory Visit (HOSPITAL_COMMUNITY): Payer: Self-pay

## 2021-10-24 ENCOUNTER — Ambulatory Visit (HOSPITAL_BASED_OUTPATIENT_CLINIC_OR_DEPARTMENT_OTHER)
Admission: RE | Admit: 2021-10-24 | Discharge: 2021-10-24 | Disposition: A | Discharge status: Home / Self Care | Payer: 59 | Attending: Orthopaedic Surgery | Admitting: Orthopaedic Surgery

## 2021-10-24 ENCOUNTER — Encounter (HOSPITAL_BASED_OUTPATIENT_CLINIC_OR_DEPARTMENT_OTHER)
Admission: RE | Disposition: A | Discharge status: Home / Self Care | Payer: Self-pay | Source: Home / Self Care | Attending: Orthopaedic Surgery

## 2021-10-24 ENCOUNTER — Encounter: Payer: Self-pay | Admitting: Orthopaedic Surgery

## 2021-10-24 ENCOUNTER — Ambulatory Visit (HOSPITAL_BASED_OUTPATIENT_CLINIC_OR_DEPARTMENT_OTHER): Payer: 59 | Admitting: Anesthesiology

## 2021-10-24 ENCOUNTER — Encounter (HOSPITAL_BASED_OUTPATIENT_CLINIC_OR_DEPARTMENT_OTHER): Payer: Self-pay | Admitting: Orthopaedic Surgery

## 2021-10-24 ENCOUNTER — Other Ambulatory Visit: Payer: Self-pay

## 2021-10-24 DIAGNOSIS — M94261 Chondromalacia, right knee: Secondary | ICD-10-CM | POA: Diagnosis not present

## 2021-10-24 DIAGNOSIS — X58XXXA Exposure to other specified factors, initial encounter: Secondary | ICD-10-CM | POA: Insufficient documentation

## 2021-10-24 DIAGNOSIS — S83241A Other tear of medial meniscus, current injury, right knee, initial encounter: Secondary | ICD-10-CM

## 2021-10-24 DIAGNOSIS — D759 Disease of blood and blood-forming organs, unspecified: Secondary | ICD-10-CM | POA: Insufficient documentation

## 2021-10-24 DIAGNOSIS — S83231A Complex tear of medial meniscus, current injury, right knee, initial encounter: Secondary | ICD-10-CM | POA: Insufficient documentation

## 2021-10-24 DIAGNOSIS — S83242A Other tear of medial meniscus, current injury, left knee, initial encounter: Secondary | ICD-10-CM | POA: Diagnosis present

## 2021-10-24 DIAGNOSIS — S83281A Other tear of lateral meniscus, current injury, right knee, initial encounter: Secondary | ICD-10-CM

## 2021-10-24 DIAGNOSIS — D649 Anemia, unspecified: Secondary | ICD-10-CM | POA: Insufficient documentation

## 2021-10-24 DIAGNOSIS — Z6832 Body mass index (BMI) 32.0-32.9, adult: Secondary | ICD-10-CM | POA: Insufficient documentation

## 2021-10-24 DIAGNOSIS — M659 Synovitis and tenosynovitis, unspecified: Secondary | ICD-10-CM | POA: Diagnosis not present

## 2021-10-24 DIAGNOSIS — I10 Essential (primary) hypertension: Secondary | ICD-10-CM | POA: Diagnosis not present

## 2021-10-24 HISTORY — PX: KNEE ARTHROSCOPY WITH MEDIAL MENISECTOMY: SHX5651

## 2021-10-24 SURGERY — ARTHROSCOPY, KNEE, WITH MEDIAL MENISCECTOMY
Anesthesia: General | Site: Knee | Laterality: Right

## 2021-10-24 MED ORDER — ONDANSETRON HCL 4 MG/2ML IJ SOLN
INTRAMUSCULAR | Status: DC | PRN
  Administered 2021-10-24: 4 mg via INTRAVENOUS

## 2021-10-24 MED ORDER — BUPIVACAINE HCL (PF) 0.25 % IJ SOLN
INTRAMUSCULAR | Status: AC
  Filled 2021-10-24: qty 30

## 2021-10-24 MED ORDER — LACTATED RINGERS IV SOLN: INTRAVENOUS | Status: DC

## 2021-10-24 MED ORDER — ONDANSETRON HCL 4 MG/2ML IJ SOLN: 4.0000 mg | Freq: Once | INTRAMUSCULAR | Status: DC | PRN

## 2021-10-24 MED ORDER — BUPIVACAINE HCL (PF) 0.25 % IJ SOLN
INTRAMUSCULAR | Status: DC | PRN
Start: 2021-10-24 — End: 2021-10-24
  Administered 2021-10-24: 20 mL

## 2021-10-24 MED ORDER — SODIUM CHLORIDE 0.9 % IR SOLN
Status: DC | PRN
  Administered 2021-10-24: 1200 mL

## 2021-10-24 MED ORDER — FENTANYL CITRATE (PF) 100 MCG/2ML IJ SOLN
INTRAMUSCULAR | Status: DC | PRN
  Administered 2021-10-24 (×4): 50 ug via INTRAVENOUS

## 2021-10-24 MED ORDER — FENTANYL CITRATE (PF) 100 MCG/2ML IJ SOLN
INTRAMUSCULAR | Status: AC
  Filled 2021-10-24: qty 2

## 2021-10-24 MED ORDER — OXYCODONE-ACETAMINOPHEN 5-325 MG PO TABS
1.0000 | ORAL_TABLET | Freq: Three times a day (TID) | ORAL | 0 refills | Status: DC | PRN
  Filled 2021-10-24: qty 30, 5d supply, fill #0

## 2021-10-24 MED ORDER — PROPOFOL 10 MG/ML IV BOLUS
INTRAVENOUS | Status: DC | PRN
  Administered 2021-10-24: 150 mg via INTRAVENOUS
  Administered 2021-10-24: 20 mg via INTRAVENOUS

## 2021-10-24 MED ORDER — CEFAZOLIN SODIUM-DEXTROSE 2-4 GM/100ML-% IV SOLN
INTRAVENOUS | Status: AC
  Filled 2021-10-24: qty 100

## 2021-10-24 MED ORDER — MIDAZOLAM HCL 5 MG/5ML IJ SOLN
INTRAMUSCULAR | Status: DC | PRN
  Administered 2021-10-24: 2 mg via INTRAVENOUS

## 2021-10-24 MED ORDER — DEXAMETHASONE SODIUM PHOSPHATE 10 MG/ML IJ SOLN
INTRAMUSCULAR | Status: AC
  Filled 2021-10-24: qty 1

## 2021-10-24 MED ORDER — OXYCODONE HCL 5 MG/5ML PO SOLN: 5.0000 mg | Freq: Once | ORAL | Status: AC | PRN

## 2021-10-24 MED ORDER — LIDOCAINE HCL (CARDIAC) PF 100 MG/5ML IV SOSY
PREFILLED_SYRINGE | INTRAVENOUS | Status: DC | PRN
  Administered 2021-10-24: 60 mg via INTRAVENOUS

## 2021-10-24 MED ORDER — ACETAMINOPHEN 325 MG PO TABS: 325.0000 mg | ORAL_TABLET | ORAL | Status: DC | PRN

## 2021-10-24 MED ORDER — DEXAMETHASONE SODIUM PHOSPHATE 4 MG/ML IJ SOLN
INTRAMUSCULAR | Status: DC | PRN
  Administered 2021-10-24: 10 mg via INTRAVENOUS

## 2021-10-24 MED ORDER — MEPERIDINE HCL 25 MG/ML IJ SOLN: 6.2500 mg | INTRAMUSCULAR | Status: DC | PRN

## 2021-10-24 MED ORDER — ACETAMINOPHEN 160 MG/5ML PO SOLN: 325.0000 mg | ORAL | Status: DC | PRN

## 2021-10-24 MED ORDER — FENTANYL CITRATE (PF) 100 MCG/2ML IJ SOLN
INTRAMUSCULAR | Status: AC
Start: 2021-10-24 — End: ?
  Filled 2021-10-24: qty 2

## 2021-10-24 MED ORDER — PROPOFOL 10 MG/ML IV BOLUS
INTRAVENOUS | Status: AC
  Filled 2021-10-24: qty 20

## 2021-10-24 MED ORDER — FENTANYL CITRATE (PF) 100 MCG/2ML IJ SOLN
25.0000 ug | INTRAMUSCULAR | Status: DC | PRN
  Administered 2021-10-24 (×3): 50 ug via INTRAVENOUS

## 2021-10-24 MED ORDER — OXYCODONE HCL 5 MG PO TABS
ORAL_TABLET | ORAL | Status: AC
  Filled 2021-10-24: qty 1

## 2021-10-24 MED ORDER — ONDANSETRON HCL 4 MG/2ML IJ SOLN
INTRAMUSCULAR | Status: AC
  Filled 2021-10-24: qty 2

## 2021-10-24 MED ORDER — LIDOCAINE 2% (20 MG/ML) 5 ML SYRINGE
INTRAMUSCULAR | Status: AC
  Filled 2021-10-24: qty 5

## 2021-10-24 MED ORDER — CEFAZOLIN SODIUM-DEXTROSE 2-4 GM/100ML-% IV SOLN
2.0000 g | INTRAVENOUS | Status: AC
  Administered 2021-10-24: 2 g via INTRAVENOUS

## 2021-10-24 MED ORDER — MIDAZOLAM HCL 2 MG/2ML IJ SOLN
INTRAMUSCULAR | Status: AC
  Filled 2021-10-24: qty 2

## 2021-10-24 MED ORDER — OXYCODONE HCL 5 MG PO TABS
5.0000 mg | ORAL_TABLET | Freq: Once | ORAL | Status: AC | PRN
  Administered 2021-10-24: 5 mg via ORAL

## 2021-10-24 SURGICAL SUPPLY — 36 items
BANDAGE ESMARK 6X9 LF (GAUZE/BANDAGES/DRESSINGS) IMPLANT
BLADE EXCALIBUR 4.0X13 (MISCELLANEOUS) ×3 IMPLANT
BLADE SHAVER TORPEDO 4X13 (MISCELLANEOUS) ×1 IMPLANT
BNDG CMPR 9X6 STRL LF SNTH (GAUZE/BANDAGES/DRESSINGS)
BNDG ELASTIC 6X5.8 VLCR STR LF (GAUZE/BANDAGES/DRESSINGS) ×6 IMPLANT
BNDG ESMARK 6X9 LF (GAUZE/BANDAGES/DRESSINGS)
BURR OVAL 8 FLU 4.0X13 (MISCELLANEOUS) IMPLANT
COOLER ICEMAN CLASSIC (MISCELLANEOUS) ×3 IMPLANT
CUFF TOURN SGL QUICK 34 (TOURNIQUET CUFF) ×2
CUFF TRNQT CYL 34X4.125X (TOURNIQUET CUFF) ×2 IMPLANT
CUTTER BONE 4.0MM X 13CM (MISCELLANEOUS) IMPLANT
DISSECTOR 3.5MM X 13CM CVD (MISCELLANEOUS) IMPLANT
DRAPE ARTHROSCOPY W/POUCH 90 (DRAPES) ×3 IMPLANT
DRAPE IMP U-DRAPE 54X76 (DRAPES) ×3 IMPLANT
DRAPE U-SHAPE 47X51 STRL (DRAPES) ×3 IMPLANT
DURAPREP 26ML APPLICATOR (WOUND CARE) ×6 IMPLANT
EXCALIBUR 3.8MM X 13CM (MISCELLANEOUS) IMPLANT
GAUZE SPONGE 4X4 12PLY STRL (GAUZE/BANDAGES/DRESSINGS) ×3 IMPLANT
GAUZE XEROFORM 1X8 LF (GAUZE/BANDAGES/DRESSINGS) ×3 IMPLANT
GLOVE SRG 8 PF TXTR STRL LF DI (GLOVE) ×2 IMPLANT
GLOVE SURG SYN 7.5  E (GLOVE) ×2
GLOVE SURG SYN 7.5 E (GLOVE) ×1 IMPLANT
GLOVE SURG SYN 7.5 PF PI (GLOVE) ×2 IMPLANT
GLOVE SURG UNDER POLY LF SZ7 (GLOVE) ×6 IMPLANT
GLOVE SURG UNDER POLY LF SZ8 (GLOVE) ×2
GOWN STRL REIN XL XLG (GOWN DISPOSABLE) ×6 IMPLANT
GOWN STRL REUS W/ TWL LRG LVL3 (GOWN DISPOSABLE) ×2 IMPLANT
GOWN STRL REUS W/TWL LRG LVL3 (GOWN DISPOSABLE) ×2
MANIFOLD NEPTUNE II (INSTRUMENTS) ×3 IMPLANT
PACK ARTHROSCOPY DSU (CUSTOM PROCEDURE TRAY) ×3 IMPLANT
PACK BASIN DAY SURGERY FS (CUSTOM PROCEDURE TRAY) ×3 IMPLANT
PAD COLD SHLDR WRAP-ON (PAD) ×3 IMPLANT
SHEET MEDIUM DRAPE 40X70 STRL (DRAPES) ×3 IMPLANT
SUT ETHILON 3 0 PS 1 (SUTURE) ×3 IMPLANT
TOWEL GREEN STERILE FF (TOWEL DISPOSABLE) ×3 IMPLANT
TUBING ARTHROSCOPY IRRIG 16FT (MISCELLANEOUS) ×3 IMPLANT

## 2021-10-24 NOTE — Op Note (Signed)
? ?  Surgery Date: 10/24/2021 ? ?PREOPERATIVE DIAGNOSES:  ?1. Right knee medial and lateral meniscus tear ?2. Right knee synovitis ?3. Right knee chondromalacia ? ?POSTOPERATIVE DIAGNOSES:  ?same ? ?PROCEDURES PERFORMED:  ?1. Right knee arthroscopy with major synovectomy ?2. Right knee arthroscopy with arthroscopic partial medial and lateral meniscectomy ?3. Right knee arthroscopy with arthroscopic chondroplasty medial femoral condyle ? ?SURGEON: N. Eduard Roux, M.D. ? ?ASSIST: Madalyn Rob, PA-C; necessary for the timely completion of procedure and due to complexity of procedure. ? ?ANESTHESIA:  general ? ?FLUIDS: Per anesthesia record.  ? ?ESTIMATED BLOOD LOSS: minimal ? ?DESCRIPTION OF PROCEDURE: Heather Diaz is a 61 y.o.-year-old female with above mentioned conditions. Full discussion held regarding risks benefits alternatives and complications related surgical intervention. Conservative care options reviewed. All questions answered. ? ?The patient was identified in the preoperative holding area and the operative extremity was marked. The patient was brought to the operating room and transferred to operating table in a supine position. Satisfactory general anesthesia was induced by anesthesiology.   ? ?Standard anterolateral, anteromedial arthroscopy portals were obtained. The anteromedial portal was obtained with a spinal needle for localization under direct visualization with subsequent diagnostic findings.  ? ?Diagnostic knee arthroscopy ensued.  We found moderate synovectomy in the medial and lateral gutters as well as superior patella pouch.  Major synovectomy was performed.  Gentle valgus force was then placed on the knee to address the medial meniscus tear.  We found a small complex tear of the posterior horn.  Partial medial meniscectomy was performed back to stable margins with meniscus basket and oscillating shaver.  Gentle chondroplasty was performed of the medial femoral condyle.  Overall there  was minimal chondromalacia there.  The cruciates were intact.  The leg was then placed in figure-of-four position.  There was grade 3 and 4 changes of the lateral femoral condyle of the weightbearing surface.  She had a large radial tear of the posterior lateral portion of the lateral meniscus anterior to the popliteus.  The displaced fragment was flipped up into the lateral gutter.  Partial lateral meniscectomy was performed for this as well.  The majority of the lateral meniscus was macerated and torn and had to be removed.  A small remaining portion of the posterior horn that was attached to the meniscal root was left intact.  The popliteus tendon itself was unremarkable.  The knee was then placed into full extension and there were no loose bodies.  Grade 3 changes of the patellofemoral surfaces.  ? ?Gutters were checked for loose bodies.  Excess fluid was removed from the knee joint.  Incisions were closed with interrupted nylon sutures.  Sterile dressings were applied.  Patient tolerated procedure well had no immediate complications. ? ?Suprapatellar pouch and gutters: moderate synovitis or debris. ?Patella chondral surface: Grade 3 ?Trochlear chondral surface: Grade 3 ?Patellofemoral tracking: normal ?Medial meniscus: small complex tear.  ?Medial femoral condyle weight bearing surface: Grade 1 ?Medial tibial plateau: Grade 1 ?Anterior cruciate ligament:stable ?Posterior cruciate ligament:stable ?Lateral meniscus: large radial complex tear.   ?Lateral femoral condyle weight bearing surface: Grade 3 ?Lateral tibial plateau: Grade 2 ? ?DISPOSITION: The patient was awakened from general anesthetic, extubated, taken to the recovery room in medically stable condition, no apparent complications. The patient may be weightbearing as tolerated to the operative lower extremity.  Range of motion of right knee as tolerated. ? ?N. Eduard Roux, MD ?Marga Hoots ?12:03 PM ? ? ?

## 2021-10-24 NOTE — Discharge Instructions (Addendum)
° ° °Post-operative patient instructions  °Knee Arthroscopy  ° °Ice:  Place intermittent ice or cooler pack over your knee, 30 minutes on and 30 minutes off.  Continue this for the first 72 hours after surgery, then save ice for use after therapy sessions or on more active days.   °Weight:  You may bear weight on your leg as your symptoms allow. °Crutches:  Use crutches (or walker) to assist in walking until told to discontinue by your physical therapist or physician. This will help to reduce pain. °Strengthening:  Perform simple thigh squeezes (isometric quad contractions) and straight leg lifts as you are able (3 sets of 5 to 10 repetitions, 3 times a day).  For the leg lifts, have someone support under your ankle in the beginning until you have increased strength enough to do this on your own.  To help get started on thigh squeezes, place a pillow under your knee and push down on the pillow with back of knee (sometimes easier to do than with your leg fully straight). °Motion:  Perform gentle knee motion as tolerated - this is gentle bending and straightening of the knee. Seated heel slides: you can start by sitting in a chair, remove your brace, and gently slide your heel back on the floor - allowing your knee to bend. Have someone help you straighten your knee (or use your other leg/foot hooked under your ankle.  °Dressing:  Perform 1st dressing change at 2 days postoperative. A moderate amount of blood tinged drainage is to be expected.  So if you bleed through the dressing on the first or second day or if you have fevers, it is fine to change the dressing/check the wounds early and redress wound. Elevate your leg.  If it bleeds through again, or if the incisions are leaking frank blood, please call the office. May change dressing every 1-2 days thereafter to help watch wounds. Can purchase Tegaderm (or 3M Nexcare) water resistant dressings at local pharmacy / [employer]. °Shower:  Light shower is ok after 2  days.  Please take shower, NO bath. Recover with gauze and ace wrap to help keep wounds protected.   °Pain medication:  A narcotic pain medication has been prescribed.  Take as directed.  Typically you need narcotic pain medication more regularly during the first 3 to 5 days after surgery.  Decrease your use of the medication as the pain improves.  Narcotics can sometimes cause constipation, even after a few doses.  If you have problems with constipation, you can take an over the counter stool softener or light laxative.  If you have persistent problems, please notify your physician’s office. °Physical therapy: Additional activity guidelines to be provided by your physician or physical therapist at follow-up visits.  °Driving: Do not recommend driving x 2 weeks post surgical, especially if surgery performed on right side. Should not drive while taking narcotic pain medications. It typically takes at least 2 weeks to restore sufficient neuromuscular function for normal reaction times for driving safety.  °Call [phone] for questions or problems. Evenings you will be forwarded to the hospital operator.  Ask for the orthopaedic physician on call. Please call if you experience:  °  °Redness, foul smelling, or persistent drainage from the surgical site  °worsening knee pain and swelling not responsive to medication  °any calf pain and or swelling of the lower leg  °temperatures greater than 101.5 F °other questions or concerns ° ° °Thank you for allowing us to be   a part of your care. ° ° °Post Anesthesia Home Care Instructions ° °Activity: °Get plenty of rest for the remainder of the day. A responsible individual must stay with you for 24 hours following the procedure.  °For the next 24 hours, DO NOT: °-Drive a car °-Operate machinery °-Drink alcoholic beverages °-Take any medication unless instructed by your physician °-Make any legal decisions or sign important papers. ° °Meals: °Start with liquid foods such as  gelatin or soup. Progress to regular foods as tolerated. Avoid greasy, spicy, heavy foods. If nausea and/or vomiting occur, drink only clear liquids until the nausea and/or vomiting subsides. Call your physician if vomiting continues. ° °Special Instructions/Symptoms: °Your throat may feel dry or sore from the anesthesia or the breathing tube placed in your throat during surgery. If this causes discomfort, gargle with warm salt water. The discomfort should disappear within 24 hours. ° °If you had a scopolamine patch placed behind your ear for the management of post- operative nausea and/or vomiting: ° °1. The medication in the patch is effective for 72 hours, after which it should be removed.  Wrap patch in a tissue and discard in the trash. Wash hands thoroughly with soap and water. °2. You may remove the patch earlier than 72 hours if you experience unpleasant side effects which may include dry mouth, dizziness or visual disturbances. °3. Avoid touching the patch. Wash your hands with soap and water after contact with the patch. °    ° °

## 2021-10-24 NOTE — Anesthesia Preprocedure Evaluation (Addendum)
Anesthesia Evaluation  ?Patient identified by MRN, date of birth, ID band ?Patient awake ? ? ? ?Reviewed: ?Allergy & Precautions, NPO status , Patient's Chart, lab work & pertinent test results ? ?Airway ?Mallampati: II ? ?TM Distance: >3 FB ? ? ? ? Dental ? ?(+) Edentulous Upper, Missing, Dental Advisory Given,  ?  ?Pulmonary ?neg pulmonary ROS,  ?  ?breath sounds clear to auscultation ? ? ? ? ? ? Cardiovascular ?hypertension, Pt. on medications and Pt. on home beta blockers ? ?Rhythm:Regular Rate:Normal ? ? ?  ?Neuro/Psych ?negative neurological ROS ?   ? GI/Hepatic ?negative GI ROS, Neg liver ROS,   ?Endo/Other  ?Morbid obesity ? Renal/GU ?negative Renal ROS  ? ?  ?Musculoskeletal ? ? Abdominal ?  ?Peds ? Hematology ? ?(+) Blood dyscrasia, anemia ,   ?Anesthesia Other Findings ? ? Reproductive/Obstetrics ? ?  ? ? ? ? ? ? ? ? ? ? ? ? ? ?  ?  ? ? ? ? ? ? ? ?Anesthesia Physical ? ?Anesthesia Plan ? ?ASA: 2 ? ?Anesthesia Plan: General  ? ?Post-op Pain Management:   ? ?Induction: Intravenous ? ?PONV Risk Score and Plan: 3 and Treatment may vary due to age or medical condition, Ondansetron and Dexamethasone ? ?Airway Management Planned: LMA ? ?Additional Equipment: None ? ?Intra-op Plan:  ? ?Post-operative Plan: Extubation in OR ? ?Informed Consent: I have reviewed the patients History and Physical, chart, labs and discussed the procedure including the risks, benefits and alternatives for the proposed anesthesia with the patient or authorized representative who has indicated his/her understanding and acceptance.  ? ? ? ?Dental advisory given ? ?Plan Discussed with: CRNA and Anesthesiologist ? ?Anesthesia Plan Comments:   ? ? ? ? ? ?Anesthesia Quick Evaluation ? ?

## 2021-10-24 NOTE — H&P (Signed)
? ? ?PREOPERATIVE H&P ? ?Chief Complaint: right medial meniscal tear, lateral meniscal tear ? ?HPI: ?Heather Diaz is a 61 y.o. female who presents for surgical treatment of right medial meniscal tear, lateral meniscal tear.  She denies any changes in medical history. ? ?Past Medical History:  ?Diagnosis Date  ? Anemia   ? thalacemia  ? Hypertension   ? ?Past Surgical History:  ?Procedure Laterality Date  ? ABDOMINAL HYSTERECTOMY    ? KNEE ARTHROSCOPY WITH MEDIAL MENISECTOMY Left 12/22/2020  ? Procedure: LEFT KNEE ARTHROSCOPY WITH PARTIAL MEDIAL MENISECTOMY;  Surgeon: Tarry Kos, MD;  Location: [facility];  Service: Orthopedics;  Laterality: Left;  ? REDUCTION MAMMAPLASTY Bilateral   ? ?Social History  ? ?Socioeconomic History  ? Marital status: Divorced  ?  Spouse name: Not on file  ? Number of children: Not on file  ? Years of education: Not on file  ? Highest education level: Not on file  ?Occupational History  ? Not on file  ?Tobacco Use  ? Smoking status: Never  ? Smokeless tobacco: Never  ?Vaping Use  ? Vaping Use: Never used  ?Substance and Sexual Activity  ? Alcohol use: Yes  ?  Comment: occassional  ? Drug use: Never  ? Sexual activity: Not Currently  ?  Birth control/protection: Surgical  ?Other Topics Concern  ? Not on file  ?Social History Narrative  ? Not on file  ? ?Social Determinants of Health  ? ?Financial Resource Strain: Not on file  ?Food Insecurity: Not on file  ?Transportation Needs: Not on file  ?Physical Activity: Not on file  ?Stress: Not on file  ?Social Connections: Not on file  ? ?History reviewed. No pertinent family history. ?Allergies  ?Allergen Reactions  ? Reglan [Metoclopramide]   ?  confusion  ? ?Prior to Admission medications   ?Medication Sig Start Date End Date Taking? Authorizing Provider  ?cholecalciferol (VITAMIN D3) 25 MCG (1000 UT) tablet Take 1,000 Units by mouth daily.   Yes [provider]  ?diclofenac (VOLTAREN) 75 MG EC tablet Take 1 tablet  (75 mg total) by mouth 2 (two) times daily. 09/24/21  Yes Cristie Hem, PA-C  ?Iron-Vitamins (GERITOL COMPLETE PO) Take 1 tablet by mouth daily.   Yes [provider]  ?metoprolol succinate (TOPROL XL) 100 MG 24 hr tablet Take 1 tablet by mouth once a day 06/04/21  Yes   ?Multiple Vitamins-Minerals (ADULT GUMMY) CHEW Chew 1 each by mouth daily.   Yes [provider]  ?triamterene-hydrochlorothiazide (MAXZIDE) 75-50 MG tablet TAKE 1 TABLET BY MOUTH EVERY MORNING 05/30/20 10/18/21 Yes Laurann Montana, MD  ?triamterene-hydrochlorothiazide (MAXZIDE) 75-50 MG tablet Take 1 tablet by mouth in the morning 06/04/21  Yes   ?zolpidem (AMBIEN) 10 MG tablet Take 1/2 to 1 tablet by mouth at bedtime as needed  **CAN FILL 12/14/20 12/14/20  Yes   ?estradiol (VIVELLE-DOT) 0.05 MG/24HR patch Apply 1 patch onto skin twice a week 06/04/21     ?estradiol (VIVELLE-DOT) 0.075 MG/24HR Place 1 patch onto the skin every 3 (three) days. 02/15/19   [provider]  ?estradiol (VIVELLE-DOT) 0.075 MG/24HR apply 1 patch onto the skin twice a week 04/19/20   Laurann Montana, MD  ?metoprolol succinate (TOPROL-XL) 100 MG 24 hr tablet Take 100 mg by mouth daily. 04/27/19   [provider]  ?metoprolol succinate (TOPROL-XL) 100 MG 24 hr tablet TAKE 1 TABLET BY MOUTH ONCE A DAY 05/30/20 05/30/21  Laurann Montana, MD  ?zolpidem [facility]  CR) 6.25 MG CR tablet TAKE 1 TABLET BY MOUTH AT BEDTIME 08/30/20 02/26/21  Laurann Montana, MD  ?zolpidem (AMBIEN) 10 MG tablet Take 1/2-1 tablet by mouth at bedtime as needed 06/04/21     ?Zoster Vaccine Adjuvanted [facility]) injection To be administered by the pharmacist 03/14/21   Judyann Munson, MD  ? ? ? ?Positive ROS: All other systems have been reviewed and were otherwise negative with the exception of those mentioned in the HPI and as above. ? ?Physical Exam: ?General: Alert, no acute distress ?Cardiovascular: No pedal edema ?Respiratory: No cyanosis, no use of accessory  musculature ?GI: abdomen soft ?Skin: No lesions in the area of chief complaint ?Neurologic: Sensation intact distally ?Psychiatric: Patient is competent for consent with normal mood and affect ?Lymphatic: no lymphedema ? ?MUSCULOSKELETAL: exam stable ? ?Assessment: ?right medial meniscal tear, lateral meniscal tear ? ?Plan: ?Plan for Procedure(s): ?right knee partial medial and partial lateral meniscectomy ? ?The risks benefits and alternatives were discussed with the patient including but not limited to the risks of nonoperative treatment, versus surgical intervention including infection, bleeding, nerve injury,  blood clots, cardiopulmonary complications, morbidity, mortality, among others, and they were willing to proceed.  ? ?Preoperative templating of the joint replacement has been completed, documented, and submitted to the Operating Room personnel in order to optimize intra-operative equipment management. ? ? ?Glee Arvin, MD ?10/24/2021 ?9:38 AM ? ?

## 2021-10-24 NOTE — Anesthesia Procedure Notes (Signed)
Procedure Name: LMA Insertion ?Date/Time: 10/24/2021 11:15 AM ?Performed by: Burna Cash, CRNA ?Pre-anesthesia Checklist: Patient identified, Emergency Drugs available, Suction available and Patient being monitored ?Patient Re-evaluated:Patient Re-evaluated prior to induction ?Oxygen Delivery Method: Circle system utilized ?Preoxygenation: Pre-oxygenation with 100% oxygen ?Induction Type: IV induction ?Ventilation: Mask ventilation without difficulty ?LMA: LMA inserted ?LMA Size: 4.0 ?Number of attempts: 1 ?Airway Equipment and Method: Bite block ?Placement Confirmation: positive ETCO2 ?Tube secured with: Tape ?Dental Injury: Teeth and Oropharynx as per pre-operative assessment  ? ? ? ? ?

## 2021-10-24 NOTE — Transfer of Care (Signed)
Immediate Anesthesia Transfer of Care Note ? ?Patient: Heather Diaz ? ?Procedure(s) Performed: right knee arthroscopy partial medial and partial lateral meniscectomy and synovectomy (Right: Knee) ? ?Patient Location: PACU ? ?Anesthesia Type:General ? ?Level of Consciousness: awake, alert  and oriented ? ?Airway & Oxygen Therapy: Patient Spontanous Breathing and Patient connected to face mask oxygen ? ?Post-op Assessment: Report given to RN and Post -op Vital signs reviewed and stable ? ?Post vital signs: Reviewed and stable ? ?Last Vitals:  ?Vitals Value Taken Time  ?BP    ?Temp 36.7 ?C 10/24/21 1215  ?Pulse 88 10/24/21 1217  ?Resp 17 10/24/21 1217  ?SpO2 100 % 10/24/21 1217  ?Vitals shown include unvalidated device data. ? ?Last Pain:  ?Vitals:  ? 10/24/21 0818  ?TempSrc: Oral  ?PainSc: 3   ?   ? ?  ? ?Complications: No notable events documented. ?

## 2021-10-25 ENCOUNTER — Telehealth: Payer: Self-pay | Admitting: Orthopaedic Surgery

## 2021-10-25 ENCOUNTER — Encounter (HOSPITAL_BASED_OUTPATIENT_CLINIC_OR_DEPARTMENT_OTHER): Payer: Self-pay | Admitting: Orthopaedic Surgery

## 2021-10-25 NOTE — Anesthesia Postprocedure Evaluation (Signed)
Anesthesia Post Note ? ?Patient: [name] ? ?Procedure(s) Performed: right knee arthroscopy partial medial and partial lateral meniscectomy and synovectomy (Right: Knee) ? ?  ? ?Patient location during evaluation: PACU ?Anesthesia Type: General ?Level of consciousness: awake and alert ?Pain management: pain level controlled ?Vital Signs Assessment: post-procedure vital signs reviewed and stable ?Respiratory status: spontaneous breathing, nonlabored ventilation, respiratory function stable and patient connected to nasal cannula oxygen ?Cardiovascular status: blood pressure returned to baseline and stable ?Postop Assessment: no apparent nausea or vomiting ?Anesthetic complications: no ? ? ?No notable events documented. ? ?Last Vitals:  ?Vitals:  ? 10/24/21 1300 10/24/21 1315  ?BP: (!) 148/77 (!) 136/93  ?Pulse: 85 82  ?Resp: 16 18  ?Temp:  36.5 ?C  ?SpO2: 93% 99%  ?  ?Last Pain:  ?Vitals:  ? 10/24/21 0818  ?TempSrc: Oral  ? ? ?  ?  ?  ?  ?  ?  ? ?Heather Diaz ? ? ? ? ?

## 2021-10-25 NOTE — Telephone Encounter (Signed)
Per [employer], patient states needs both forms emailed to her as she states they have not been received. I emailed [employer] forms to pt at [email or web address]. ?

## 2021-10-31 ENCOUNTER — Other Ambulatory Visit: Payer: Self-pay

## 2021-10-31 ENCOUNTER — Ambulatory Visit (INDEPENDENT_AMBULATORY_CARE_PROVIDER_SITE_OTHER): Payer: 59 | Admitting: Physician Assistant

## 2021-10-31 ENCOUNTER — Encounter: Payer: Self-pay | Admitting: Orthopaedic Surgery

## 2021-10-31 DIAGNOSIS — Z9889 Other specified postprocedural states: Secondary | ICD-10-CM

## 2021-10-31 NOTE — Progress Notes (Signed)
? ?Post-Op Visit Note ?  ?Patient: Heather Diaz           ?Date of Birth: 1961-07-08           ?MRN: [id number] ?Visit Date: 10/31/2021 ?PCP: Harlan Stains, MD ? ? ?Assessment & Plan: ? ?Chief Complaint:  ?Chief Complaint  ?Patient presents with  ? Right Knee - Routine Post Op  ? ?Visit Diagnoses:  ?1. S/P right knee arthroscopy   ? ? ?Plan: Patient is a pleasant 61 year old female comes in today 1 week status post right knee arthroscopic debridement medial lateral meniscus 10/24/2021.  It was noted during operative intervention she had grade 3 and 4 changes to the lateral femoral condyle as well as grade 3 changes to the patellofemoral surfaces.  She has been doing well in regards to her knee, but notes she has had calf pain since getting home the day of surgery.  The first few days, she was unable to bear weight to the right lower extremity due to this pain.  This has improved with time.  No fevers or chills.  No personal history of DVT/PE.  She does note her sister had a DVT from previous knee arthroscopy and her mom had a CVA from total knee arthroplasty.  She is a non-smoker and does not take oral contraceptives.  She does not take any anticoagulants.  No chest pain or shortness of breath.  Examination of the right knee reveals fully healed surgical portals without complication.  Calf has diffuse moderate tenderness throughout.  She does have a positive Homan.  She is neurovascular intact distally.  At this point, the low suspicion for DVT, but due to recent surgery and her family history I would like to obtain right lower extremity ultrasound to rule this outs.  An exercise program provided.  Follow-up with [place] in 5 weeks time for repeat evaluation.  Call with concerns or questions. ? ?Follow-Up Instructions: Return in about 5 weeks (around 12/05/2021).  ? ?Orders:  ?Orders Placed This Encounter  ?Procedures  ? VAS [place] LOWER EXTREMITY VENOUS (DVT)  ? ?No orders of the defined types were placed in this  encounter. ? ? ?Imaging: ?No new imaging ? ?PMFS History: ?Patient Active Problem List  ? Diagnosis Date Noted  ? Synovitis of knee   ? Acute medial meniscus tear of right knee 10/18/2021  ? Acute lateral meniscus tear of right knee 10/18/2021  ? Acute medial meniscus tear of left knee 12/22/2020  ? Acute tear lateral meniscus, left, initial encounter 12/22/2020  ? ?Past Medical History:  ?Diagnosis Date  ? Anemia   ? thalacemia  ? Hypertension   ?  ?History reviewed. No pertinent family history.  ?Past Surgical History:  ?Procedure Laterality Date  ? ABDOMINAL HYSTERECTOMY    ? KNEE ARTHROSCOPY WITH MEDIAL MENISECTOMY Left 12/22/2020  ? Procedure: LEFT KNEE ARTHROSCOPY WITH PARTIAL MEDIAL MENISECTOMY;  Surgeon: Leandrew Koyanagi, MD;  Location: [facility];  Service: Orthopedics;  Laterality: Left;  ? KNEE ARTHROSCOPY WITH MEDIAL MENISECTOMY Right 10/24/2021  ? Procedure: right knee arthroscopy partial medial and partial lateral meniscectomy and synovectomy;  Surgeon: Leandrew Koyanagi, MD;  Location: [facility];  Service: Orthopedics;  Laterality: Right;  ? REDUCTION MAMMAPLASTY Bilateral   ? ?Social History  ? ?Occupational History  ? Not on file  ?Tobacco Use  ? Smoking status: Never  ? Smokeless tobacco: Never  ?Vaping Use  ? Vaping Use: Never used  ?Substance and Sexual Activity  ?  Alcohol use: Yes  ?  Comment: occassional  ? Drug use: Never  ? Sexual activity: Not Currently  ?  Birth control/protection: Surgical  ? ? ? ?

## 2021-11-01 ENCOUNTER — Ambulatory Visit (HOSPITAL_COMMUNITY)
Admission: RE | Admit: 2021-11-01 | Discharge: 2021-11-01 | Disposition: A | Discharge status: Home / Self Care | Payer: 59 | Source: Ambulatory Visit | Attending: Orthopaedic Surgery | Admitting: Orthopaedic Surgery

## 2021-11-01 DIAGNOSIS — Z9889 Other specified postprocedural states: Secondary | ICD-10-CM | POA: Diagnosis not present

## 2021-11-01 NOTE — Progress Notes (Signed)
Lower extremity venous duplex has been completed.  ? ?Preliminary results in CV Proc.  ? ?Heather Diaz Heather Diaz ?11/01/2021 2:09 PM    ?

## 2021-11-23 ENCOUNTER — Ambulatory Visit: Payer: 59 | Admitting: Orthopaedic Surgery

## 2021-12-07 ENCOUNTER — Ambulatory Visit: Payer: 59 | Admitting: Orthopaedic Surgery

## 2021-12-07 ENCOUNTER — Encounter: Payer: Self-pay | Admitting: Orthopaedic Surgery

## 2021-12-07 DIAGNOSIS — M25561 Pain in right knee: Secondary | ICD-10-CM | POA: Diagnosis not present

## 2021-12-07 DIAGNOSIS — Z9889 Other specified postprocedural states: Secondary | ICD-10-CM

## 2021-12-07 MED ORDER — LIDOCAINE HCL 1 % IJ SOLN
2.0000 mL | INTRAMUSCULAR | Status: AC | PRN
  Administered 2021-12-07: 2 mL

## 2021-12-07 MED ORDER — BUPIVACAINE HCL 0.5 % IJ SOLN
2.0000 mL | INTRAMUSCULAR | Status: AC | PRN
  Administered 2021-12-07: 2 mL via INTRA_ARTICULAR

## 2021-12-07 MED ORDER — METHYLPREDNISOLONE ACETATE 40 MG/ML IJ SUSP
40.0000 mg | INTRAMUSCULAR | Status: AC | PRN
  Administered 2021-12-07: 40 mg via INTRA_ARTICULAR

## 2021-12-07 NOTE — Progress Notes (Signed)
? ?Office Visit Note ?  ?Patient: Heather Diaz           ?Date of Birth: Sep 27, 1960           ?MRN: [id number] ?Visit Date: 12/07/2021 ?             ?Requested by: Harlan Stains, MD ?[address] ?[address] ?[place],  [place] [address] ?PCP: Harlan Stains, MD ? ? ?Assessment & Plan: ?Visit Diagnoses:  ?1. S/P right knee arthroscopy   ? ? ?Plan: Heather Diaz is 6 weeks status post right knee scope.  She reports swelling and pain and stiffness in the morning. ? ?Examination of the right knee shows fully healed surgical scars.  There is no warmth or evidence of infection.  She does have a significant joint effusion.  Pain with range of motion. ? ?I was able to aspirate 50 cc of inflamed synovial fluid and then I injected cortisone.  She had immediate relief from this.  She will take it easy for the next couple weeks and then increase activity as tolerated.  Follow-up as needed. ? ?Follow-Up Instructions: No follow-ups on file.  ? ?Orders:  ?No orders of the defined types were placed in this encounter. ? ?No orders of the defined types were placed in this encounter. ? ? ? ? Procedures: ?Large Joint Inj: R knee on 12/07/2021 10:27 AM ?Indications: pain ?Details: 22 G needle ? ?Arthrogram: No ? ?Medications: 40 mg methylPREDNISolone acetate 40 MG/ML; 2 mL lidocaine 1 %; 2 mL bupivacaine 0.5 % ?Consent was given by the patient. Patient was prepped and draped in the usual sterile fashion.  ? ? ? ? ?Clinical Data: ?No additional findings. ? ? ?Subjective: ?Chief Complaint  ?Patient presents with  ? Right Knee - Follow-up  ?  Right knee arthroscopy 10/24/2021  ? ? ?HPI ? ?Review of Systems ? ? ?Objective: ?Vital Signs: There were no vitals taken for this visit. ? ?Physical Exam ? ?Ortho Exam ? ?Specialty Comments:  ?No specialty comments available. ? ?Imaging: ?No results found. ? ? ?PMFS History: ?Patient Active Problem List  ? Diagnosis Date Noted  ? Synovitis of knee   ? Acute medial meniscus tear of right knee 10/18/2021  ?  Acute lateral meniscus tear of right knee 10/18/2021  ? Acute medial meniscus tear of left knee 12/22/2020  ? Acute tear lateral meniscus, left, initial encounter 12/22/2020  ? ?Past Medical History:  ?Diagnosis Date  ? Anemia   ? thalacemia  ? Hypertension   ?  ?No family history on file.  ?Past Surgical History:  ?Procedure Laterality Date  ? ABDOMINAL HYSTERECTOMY    ? KNEE ARTHROSCOPY WITH MEDIAL MENISECTOMY Left 12/22/2020  ? Procedure: LEFT KNEE ARTHROSCOPY WITH PARTIAL MEDIAL MENISECTOMY;  Surgeon: Leandrew Koyanagi, MD;  Location: [facility];  Service: Orthopedics;  Laterality: Left;  ? KNEE ARTHROSCOPY WITH MEDIAL MENISECTOMY Right 10/24/2021  ? Procedure: right knee arthroscopy partial medial and partial lateral meniscectomy and synovectomy;  Surgeon: Leandrew Koyanagi, MD;  Location: [facility];  Service: Orthopedics;  Laterality: Right;  ? REDUCTION MAMMAPLASTY Bilateral   ? ?Social History  ? ?Occupational History  ? Not on file  ?Tobacco Use  ? Smoking status: Never  ? Smokeless tobacco: Never  ?Vaping Use  ? Vaping Use: Never used  ?Substance and Sexual Activity  ? Alcohol use: Yes  ?  Comment: occassional  ? Drug use: Never  ? Sexual activity: Not Currently  ?  Birth control/protection: Surgical  ? ? ? ? ? ? ?

## 2021-12-08 ENCOUNTER — Other Ambulatory Visit (HOSPITAL_COMMUNITY): Payer: Self-pay

## 2021-12-10 ENCOUNTER — Other Ambulatory Visit (HOSPITAL_COMMUNITY): Payer: Self-pay

## 2021-12-10 ENCOUNTER — Other Ambulatory Visit (HOSPITAL_BASED_OUTPATIENT_CLINIC_OR_DEPARTMENT_OTHER): Payer: Self-pay

## 2021-12-10 MED ORDER — ZOLPIDEM TARTRATE 10 MG PO TABS
5.0000 mg | ORAL_TABLET | Freq: Every evening | ORAL | 0 refills | Status: DC | PRN
  Filled 2021-12-10: qty 90, 90d supply, fill #0

## 2021-12-10 MED ORDER — ALPRAZOLAM 0.5 MG PO TABS
ORAL_TABLET | ORAL | 0 refills | Status: DC
  Filled 2021-12-10: qty 30, 30d supply, fill #0

## 2021-12-10 MED ORDER — ALPRAZOLAM 0.5 MG PO TABS
0.5000 mg | ORAL_TABLET | Freq: Every day | ORAL | 0 refills | Status: DC | PRN
  Filled 2021-12-10: qty 30, 30d supply, fill #0

## 2021-12-10 MED ORDER — ZOLPIDEM TARTRATE 10 MG PO TABS
ORAL_TABLET | ORAL | 0 refills | Status: DC
  Filled 2021-12-10: qty 90, 90d supply, fill #0

## 2021-12-26 ENCOUNTER — Encounter: Payer: Self-pay | Admitting: Orthopaedic Surgery

## 2021-12-26 NOTE — Telephone Encounter (Signed)
Let's have her come back in for eval

## 2021-12-28 ENCOUNTER — Ambulatory Visit: Payer: 59 | Admitting: Orthopaedic Surgery

## 2021-12-28 ENCOUNTER — Other Ambulatory Visit (HOSPITAL_COMMUNITY): Payer: Self-pay

## 2021-12-28 DIAGNOSIS — Z9889 Other specified postprocedural states: Secondary | ICD-10-CM

## 2021-12-28 NOTE — Progress Notes (Signed)
   Post-Op Visit Note   Patient: Heather Diaz           Date of Birth: Dec 19, 1960           MRN: [id number] Visit Date: 12/28/2021 PCP: Harlan Stains, MD   Assessment & Plan:  Chief Complaint:  Chief Complaint  Patient presents with   Right Knee - Pain   Visit Diagnoses:  1. S/P right knee arthroscopy     Plan: Koi returns today for continued right knee swelling and discomfort.  She is 9 weeks status post right knee scope and medial and lateral meniscal debridement.  She feels that effusion has reaccumulated.  She is frustrated that this recovery has not been as easy as the left knee.  Examination right knee shows medium sized joint effusion.  No signs of infection.  Range of motion is normal.  No joint line tenderness.  Based on findings I feel that her knee is still inflamed from the surgery.  She did have grade 3 changes in her right knee that she did not have in the left knee.  She declined reaspiration and cortisone injection and would like to just take it easy and see if this will get better on its own.  Her FMLA runs out on June 22 which is another 4 weeks from now.  If she needs this extended she will come back to see [place].  Follow-Up Instructions: No follow-ups on file.   Orders:  No orders of the defined types were placed in this encounter.  No orders of the defined types were placed in this encounter.   Imaging: No results found.  PMFS History: Patient Active Problem List   Diagnosis Date Noted   Synovitis of knee    Acute medial meniscus tear of right knee 10/18/2021   Acute lateral meniscus tear of right knee 10/18/2021   Acute medial meniscus tear of left knee 12/22/2020   Acute tear lateral meniscus, left, initial encounter 12/22/2020   Past Medical History:  Diagnosis Date   Anemia    thalacemia   Hypertension     No family history on file.  Past Surgical History:  Procedure Laterality Date   ABDOMINAL HYSTERECTOMY     KNEE ARTHROSCOPY WITH  MEDIAL MENISECTOMY Left 12/22/2020   Procedure: LEFT KNEE ARTHROSCOPY WITH PARTIAL MEDIAL MENISECTOMY;  Surgeon: Leandrew Koyanagi, MD;  Location: [facility];  Service: Orthopedics;  Laterality: Left;   KNEE ARTHROSCOPY WITH MEDIAL MENISECTOMY Right 10/24/2021   Procedure: right knee arthroscopy partial medial and partial lateral meniscectomy and synovectomy;  Surgeon: Leandrew Koyanagi, MD;  Location: [facility];  Service: Orthopedics;  Laterality: Right;   REDUCTION MAMMAPLASTY Bilateral    Social History   Occupational History   Not on file  Tobacco Use   Smoking status: Never   Smokeless tobacco: Never  Vaping Use   Vaping Use: Never used  Substance and Sexual Activity   Alcohol use: Yes    Comment: occassional   Drug use: Never   Sexual activity: Not Currently    Birth control/protection: Surgical

## 2022-01-18 ENCOUNTER — Telehealth: Payer: Self-pay | Admitting: Orthopaedic Surgery

## 2022-01-18 NOTE — Telephone Encounter (Signed)
[employer] forms received. To [employer]. ?

## 2022-01-21 ENCOUNTER — Encounter: Payer: Self-pay | Admitting: Orthopaedic Surgery

## 2022-01-21 NOTE — Telephone Encounter (Signed)
Yes that's fine for intermittent leave.  Thanks.

## 2022-01-21 NOTE — Telephone Encounter (Signed)
Patient wants to start intermittent leave on 01/25/2022. She may miss up to two days per week.

## 2022-01-22 ENCOUNTER — Telehealth: Payer: Self-pay | Admitting: Orthopaedic Surgery

## 2022-01-22 NOTE — Telephone Encounter (Signed)
[employer] forms received. To [employer]. 

## 2022-01-24 ENCOUNTER — Other Ambulatory Visit (HOSPITAL_COMMUNITY): Payer: Self-pay

## 2022-01-29 ENCOUNTER — Ambulatory Visit: Payer: 59 | Admitting: Orthopaedic Surgery

## 2022-02-04 ENCOUNTER — Encounter: Payer: Self-pay | Admitting: Orthopaedic Surgery

## 2022-02-12 ENCOUNTER — Other Ambulatory Visit (HOSPITAL_COMMUNITY): Payer: Self-pay

## 2022-02-12 ENCOUNTER — Ambulatory Visit: Payer: 59 | Admitting: Orthopaedic Surgery

## 2022-02-12 DIAGNOSIS — M1711 Unilateral primary osteoarthritis, right knee: Secondary | ICD-10-CM

## 2022-02-12 DIAGNOSIS — Z9889 Other specified postprocedural states: Secondary | ICD-10-CM | POA: Diagnosis not present

## 2022-02-12 MED ORDER — CYCLOBENZAPRINE HCL 5 MG PO TABS
5.0000 mg | ORAL_TABLET | Freq: Every evening | ORAL | 3 refills | Status: DC | PRN
  Filled 2022-02-12: qty 30, 30d supply, fill #0
  Filled 2022-04-01: qty 30, 30d supply, fill #1

## 2022-02-12 MED ORDER — BUPIVACAINE HCL 0.5 % IJ SOLN
2.0000 mL | INTRAMUSCULAR | Status: AC | PRN
  Administered 2022-02-12: 2 mL via INTRA_ARTICULAR

## 2022-02-12 MED ORDER — CELECOXIB 200 MG PO CAPS
200.0000 mg | ORAL_CAPSULE | Freq: Two times a day (BID) | ORAL | 3 refills | Status: DC
  Filled 2022-02-12: qty 30, 15d supply, fill #0
  Filled 2022-04-01: qty 30, 15d supply, fill #1

## 2022-02-12 MED ORDER — METHYLPREDNISOLONE ACETATE 40 MG/ML IJ SUSP
40.0000 mg | INTRAMUSCULAR | Status: AC | PRN
  Administered 2022-02-12: 40 mg via INTRA_ARTICULAR

## 2022-02-12 MED ORDER — LIDOCAINE HCL 1 % IJ SOLN
2.0000 mL | INTRAMUSCULAR | Status: AC | PRN
  Administered 2022-02-12: 2 mL

## 2022-02-12 NOTE — Progress Notes (Signed)
Office Visit Note   Patient: Heather Diaz           Date of Birth: [birth year]           MRN: [id number] Visit Date: 02/12/2022              Requested by: Heather Montana, MD [phone] Heather Diaz [address] [place],  [place] [address] PCP: Heather Montana, MD   Assessment & Plan: Visit Diagnoses:  1. S/P right knee arthroscopy   2. Primary osteoarthritis of right knee     Plan: Impression is right knee effusion status post right knee arthroscopy.  We aspirated the knee today and got about 30 cc of inflamed synovial fluid.  Cortisone injected.  She felt immediate pain relief.  I will send in prescription for Celebrex and Flexeril.  Activity as tolerated.  Follow-up as needed.  Follow-Up Instructions: No follow-ups on file.   Orders:  Orders Placed This Encounter  Procedures   Large Joint Inj   Meds ordered this encounter  Medications   celecoxib (CELEBREX) 200 MG capsule    Sig: Take 1 capsule (200 mg total) by mouth 2 (two) times daily.    Dispense:  30 capsule    Refill:  3   cyclobenzaprine (FLEXERIL) 5 MG tablet    Sig: Take 1 tablet (5 mg total) by mouth at bedtime as needed for muscle spasms.    Dispense:  30 tablet    Refill:  3      Procedures: Large Joint Inj: R knee on 02/12/2022 9:50 AM Indications: pain Details: 22 G needle  Arthrogram: No  Medications: 40 mg methylPREDNISolone acetate 40 MG/ML; 2 mL lidocaine 1 %; 2 mL bupivacaine 0.5 % Consent was given by the patient. Patient was prepped and draped in the usual sterile fashion.       Clinical Data: No additional findings.   Subjective: Chief Complaint  Patient presents with   Right Knee - Pain    HPI Heather Diaz returns today for follow-up of right knee pain.  She is status post a right knee scope with medial and lateral meniscal debridement.  Continues to have swelling with increased working and walking.  Went back to work 2 weeks ago.  Review of Systems   Objective: Vital Signs: There were no  vitals taken for this visit.  Physical Exam  Ortho Exam Examination of right knee shows persistent medium joint effusion.  Decreased range of motion secondary to the effusion Specialty Comments:  No specialty comments available.  Imaging: No results found.   PMFS History: Patient Active Problem List   Diagnosis Date Noted   Synovitis of knee    Acute medial meniscus tear of right knee 10/18/2021   Acute lateral meniscus tear of right knee 10/18/2021   Acute medial meniscus tear of left knee 12/22/2020   Acute tear lateral meniscus, left, initial encounter 12/22/2020   Past Medical History:  Diagnosis Date   Anemia    thalacemia   Hypertension     No family history on file.  Past Surgical History:  Procedure Laterality Date   ABDOMINAL HYSTERECTOMY     KNEE ARTHROSCOPY WITH MEDIAL MENISECTOMY Left 12/22/2020   Procedure: LEFT KNEE ARTHROSCOPY WITH PARTIAL MEDIAL MENISECTOMY;  Surgeon: Tarry Kos, MD;  Location: [facility];  Service: Orthopedics;  Laterality: Left;   KNEE ARTHROSCOPY WITH MEDIAL MENISECTOMY Right 10/24/2021   Procedure: right knee arthroscopy partial medial and partial lateral meniscectomy and synovectomy;  Surgeon:  Tarry Kos, MD;  Location: [facility];  Service: Orthopedics;  Laterality: Right;   REDUCTION MAMMAPLASTY Bilateral    Social History   Occupational History   Not on file  Tobacco Use   Smoking status: Never   Smokeless tobacco: Never  Vaping Use   Vaping Use: Never used  Substance and Sexual Activity   Alcohol use: Yes    Comment: occassional   Drug use: Never   Sexual activity: Not Currently    Birth control/protection: Surgical

## 2022-03-06 ENCOUNTER — Telehealth: Payer: Self-pay | Admitting: Orthopaedic Surgery

## 2022-03-06 NOTE — Telephone Encounter (Signed)
[employer] forms received. To [employer]. 

## 2022-03-09 ENCOUNTER — Other Ambulatory Visit (HOSPITAL_COMMUNITY): Payer: Self-pay

## 2022-03-11 ENCOUNTER — Other Ambulatory Visit (HOSPITAL_BASED_OUTPATIENT_CLINIC_OR_DEPARTMENT_OTHER): Payer: Self-pay

## 2022-03-11 ENCOUNTER — Other Ambulatory Visit (HOSPITAL_COMMUNITY): Payer: Self-pay

## 2022-03-11 MED ORDER — ZOLPIDEM TARTRATE 10 MG PO TABS
ORAL_TABLET | ORAL | 0 refills | Status: DC
  Filled 2022-03-11: qty 90, 90d supply, fill #0

## 2022-03-31 ENCOUNTER — Encounter: Payer: Self-pay | Admitting: Orthopaedic Surgery

## 2022-04-01 ENCOUNTER — Telehealth: Payer: Self-pay | Admitting: Orthopaedic Surgery

## 2022-04-01 ENCOUNTER — Other Ambulatory Visit (HOSPITAL_COMMUNITY): Payer: Self-pay

## 2022-04-01 NOTE — Telephone Encounter (Signed)
Called patient left message to return call to schedule an appointment with Dr. Roda Shutters   per her mychart message

## 2022-04-05 ENCOUNTER — Ambulatory Visit: Payer: 59 | Admitting: Orthopaedic Surgery

## 2022-04-05 ENCOUNTER — Ambulatory Visit (INDEPENDENT_AMBULATORY_CARE_PROVIDER_SITE_OTHER): Payer: 59

## 2022-04-05 DIAGNOSIS — M1711 Unilateral primary osteoarthritis, right knee: Secondary | ICD-10-CM

## 2022-04-05 NOTE — Progress Notes (Signed)
Office Visit Note   Patient: Heather Diaz           Date of Birth: 03-16-1961           MRN: [id number] Visit Date: 04/05/2022              Requested by: Laurann Montana, MD [phone] [address] [place],  [place] [address] PCP: Laurann Montana, MD   Assessment & Plan: Visit Diagnoses:  1. Primary osteoarthritis of right knee     Plan: Impression is end-stage right knee DJD.  She has bone-on-bone in the lateral compartment.  I went over the arthroscopy pictures with her as well which show grade 3 and mainly grade 4 changes.  Unfortunately conservative management has not been able to bring significant relief therefore based on her options she has elected to move forward with a right total knee replacement.  Risk benefits rehab recovery prognosis reviewed with the patient.  We will obtain preoperative clearance from her PCP Dr. Cliffton Asters.  Denies nickel allergy or history of DVT.  Questions encouraged and answered.  Eunice Blase will call the patient to schedule surgery.  Follow-Up Instructions: No follow-ups on file.   Orders:  Orders Placed This Encounter  Procedures   XR KNEE 3 VIEW RIGHT   No orders of the defined types were placed in this encounter.     Procedures: No procedures performed   Clinical Data: No additional findings.   Subjective: Chief Complaint  Patient presents with   Right Knee - Pain    HPI Heather Diaz is a very pleasant 61 year old female who follows up today for chronic severe right knee pain.  Underwent meniscal debridement about 6 months ago.  Unfortunately she has continued to have severe pain that interferes with daily activities and quality of life.  She cannot find steps.  Feels giving way sensation constantly.  Feels swelling after activity.  Has morning stiffness.  We have tried multiple rounds of cortisone injections without significant improvement in symptoms.  Review of Systems  Constitutional: Negative.   HENT: Negative.    Eyes: Negative.    Respiratory: Negative.    Cardiovascular: Negative.   Endocrine: Negative.   Musculoskeletal: Negative.   Neurological: Negative.   Hematological: Negative.   Psychiatric/Behavioral: Negative.    All other systems reviewed and are negative.    Objective: Vital Signs: There were no vitals taken for this visit.  Physical Exam Vitals and nursing note reviewed.  Constitutional:      Appearance: She is well-developed.  HENT:     Head: Atraumatic.     Nose: Nose normal.  Eyes:     Extraocular Movements: Extraocular movements intact.  Cardiovascular:     Pulses: Normal pulses.  Pulmonary:     Effort: Pulmonary effort is normal.  Abdominal:     Palpations: Abdomen is soft.  Musculoskeletal:     Cervical back: Neck supple.  Skin:    General: Skin is warm.     Capillary Refill: Capillary refill takes less than 2 seconds.  Neurological:     Mental Status: She is alert. Mental status is at baseline.  Psychiatric:        Behavior: Behavior normal.        Thought Content: Thought content normal.        Judgment: Judgment normal.     Ortho Exam Examination of the right knee shows lateral joint line tenderness.  Pain throughout arc of motion.  Collaterals and cruciates are stable.  Pain with flexion of the knee past 70 degrees.  No joint effusion.  Specialty Comments:  No specialty comments available.  Imaging: No results found.   PMFS History: Patient Active Problem List   Diagnosis Date Noted   Synovitis of knee    Acute medial meniscus tear of right knee 10/18/2021   Acute lateral meniscus tear of right knee 10/18/2021   Acute medial meniscus tear of left knee 12/22/2020   Acute tear lateral meniscus, left, initial encounter 12/22/2020   Past Medical History:  Diagnosis Date   Anemia    thalacemia   Hypertension     No family history on file.  Past Surgical History:  Procedure Laterality Date   ABDOMINAL HYSTERECTOMY     KNEE ARTHROSCOPY WITH MEDIAL  MENISECTOMY Left 12/22/2020   Procedure: LEFT KNEE ARTHROSCOPY WITH PARTIAL MEDIAL MENISECTOMY;  Surgeon: Tarry Kos, MD;  Location: [facility];  Service: Orthopedics;  Laterality: Left;   KNEE ARTHROSCOPY WITH MEDIAL MENISECTOMY Right 10/24/2021   Procedure: right knee arthroscopy partial medial and partial lateral meniscectomy and synovectomy;  Surgeon: Tarry Kos, MD;  Location: [facility];  Service: Orthopedics;  Laterality: Right;   REDUCTION MAMMAPLASTY Bilateral    Social History   Occupational History   Not on file  Tobacco Use   Smoking status: Never   Smokeless tobacco: Never  Vaping Use   Vaping Use: Never used  Substance and Sexual Activity   Alcohol use: Yes    Comment: occassional   Drug use: Never   Sexual activity: Not Currently    Birth control/protection: Surgical

## 2022-04-18 ENCOUNTER — Other Ambulatory Visit (HOSPITAL_COMMUNITY): Payer: Self-pay

## 2022-04-18 DIAGNOSIS — I10 Essential (primary) hypertension: Secondary | ICD-10-CM | POA: Diagnosis not present

## 2022-04-18 DIAGNOSIS — F419 Anxiety disorder, unspecified: Secondary | ICD-10-CM | POA: Diagnosis not present

## 2022-04-18 DIAGNOSIS — G4700 Insomnia, unspecified: Secondary | ICD-10-CM | POA: Diagnosis not present

## 2022-04-18 DIAGNOSIS — M25561 Pain in right knee: Secondary | ICD-10-CM | POA: Diagnosis not present

## 2022-04-18 DIAGNOSIS — N951 Menopausal and female climacteric states: Secondary | ICD-10-CM | POA: Diagnosis not present

## 2022-04-18 MED ORDER — ALPRAZOLAM 0.5 MG PO TABS
0.5000 mg | ORAL_TABLET | Freq: Every day | ORAL | 2 refills | Status: DC | PRN
  Filled 2022-04-18: qty 30, 30d supply, fill #0
  Filled 2022-05-28: qty 30, 30d supply, fill #1
  Filled 2022-07-01: qty 30, 30d supply, fill #2
  Filled 2022-07-02: qty 30, 30d supply, fill #0

## 2022-04-18 MED ORDER — ZOLPIDEM TARTRATE 10 MG PO TABS
5.0000 mg | ORAL_TABLET | Freq: Every evening | ORAL | 0 refills | Status: DC | PRN
  Filled 2022-06-10: qty 90, 90d supply, fill #0

## 2022-04-18 MED ORDER — TRIAMTERENE-HCTZ 75-50 MG PO TABS
1.0000 | ORAL_TABLET | Freq: Every morning | ORAL | 1 refills | Status: DC
  Filled 2022-04-18: qty 90, 90d supply, fill #0

## 2022-04-18 MED ORDER — ESTRADIOL 0.05 MG/24HR TD PTTW
MEDICATED_PATCH | TRANSDERMAL | 0 refills | Status: DC
  Filled 2022-04-18: qty 24, 84d supply, fill #0

## 2022-04-18 MED ORDER — METOPROLOL SUCCINATE ER 200 MG PO TB24
200.0000 mg | ORAL_TABLET | Freq: Every day | ORAL | 1 refills | Status: DC
  Filled 2022-04-18: qty 90, 90d supply, fill #0

## 2022-04-19 ENCOUNTER — Other Ambulatory Visit (HOSPITAL_COMMUNITY): Payer: Self-pay

## 2022-04-22 DIAGNOSIS — M25561 Pain in right knee: Secondary | ICD-10-CM | POA: Diagnosis not present

## 2022-04-26 DIAGNOSIS — Z01818 Encounter for other preprocedural examination: Secondary | ICD-10-CM | POA: Diagnosis not present

## 2022-04-29 ENCOUNTER — Telehealth: Payer: Self-pay | Admitting: Orthopaedic Surgery

## 2022-04-29 NOTE — Telephone Encounter (Signed)
[employer] forms received. To [employer]. 

## 2022-05-02 NOTE — Pre-Procedure Instructions (Signed)
Surgical Instructions    Your procedure is scheduled on Monday, October 9th.  Report to [facility] Main Entrance "A" at 09:30 A.M., then check in with the Admitting office.  Call this number if you have problems the morning of surgery:  [phone]   If you have any questions prior to your surgery date call [phone]: Open Monday-Friday 8am-4pm    Remember:  Do not eat after midnight the night before your surgery  You may drink clear liquids until 08:30 AM the morning of your surgery.   Clear liquids allowed are: Water, Non-Citrus Juices (without pulp), Carbonated Beverages, Clear Tea, Black Coffee Only (NO MILK, CREAM OR POWDERED CREAMER of any kind), and Gatorade.  Patient Instructions  The night before surgery:  No food after midnight. ONLY clear liquids after midnight  The day of surgery (if you do NOT have diabetes):  Drink ONE (1) Pre-Surgery Clear Ensure by 08:30 AM the morning of surgery. Drink in one sitting. Do not sip.  This drink was given to you during your hospital  pre-op appointment visit.  Nothing else to drink after completing the  Pre-Surgery Clear Ensure.          If you have questions, please contact your surgeon's office.      Take these medicines the morning of surgery with A SIP OF WATER  cyclobenzaprine (FLEXERIL) metoprolol (TOPROL XL)   If needed: ALPRAZolam Duanne Moron)  As of today, STOP taking any Aspirin (unless otherwise instructed by your surgeon) Aleve, Naproxen, Ibuprofen, Motrin, Advil, Goody's, BC's, all herbal medications, fish oil, and all vitamins. This includes diclofenac Sodium (VOLTAREN) 1 % GEL.                     Do NOT Smoke (Tobacco/Vaping) for 24 hours prior to your procedure.  If you use a CPAP at night, you may bring your mask/headgear for your overnight stay.   Contacts, glasses, piercing's, hearing aid's, dentures or partials may not be worn into surgery, please bring cases for these belongings.    For patients  admitted to the hospital, discharge time will be determined by your treatment team.   Patients discharged the day of surgery will not be allowed to drive home, and someone needs to stay with them for 24 hours.  SURGICAL WAITING ROOM VISITATION Patients having surgery or a procedure may have no more than 2 support people in the waiting area - these visitors may rotate.   Children under the age of 69 must have an adult with them who is not the patient. If the patient needs to stay at the hospital during part of their recovery, the visitor guidelines for inpatient rooms apply. Pre-op nurse will coordinate an appropriate time for 1 support person to accompany patient in pre-op.  This support person may not rotate.   Please refer to the [facility] website for the visitor guidelines for Inpatients (after your surgery is over and you are in a regular room).    Special instructions:   [facility]- Preparing For Surgery  Before surgery, you can play an important role. Because skin is not sterile, your skin needs to be as free of germs as possible. You can reduce the number of germs on your skin by washing with CHG (chlorahexidine gluconate) Soap before surgery.  CHG is an antiseptic cleaner which kills germs and bonds with the skin to continue killing germs even after washing.    Oral Hygiene is also important to reduce your risk  of infection.  Remember - BRUSH YOUR TEETH THE MORNING OF SURGERY WITH YOUR REGULAR TOOTHPASTE  Please do not use if you have an allergy to CHG or antibacterial soaps. If your skin becomes reddened/irritated stop using the CHG.  Do not shave (including legs and underarms) for at least 48 hours prior to first CHG shower. It is OK to shave your face.  Please follow these instructions carefully.   Shower the NIGHT BEFORE SURGERY and the MORNING OF SURGERY  If you chose to wash your hair, wash your hair first as usual with your normal shampoo.  After you shampoo, rinse your  hair and body thoroughly to remove the shampoo.  Use CHG Soap as you would any other liquid soap. You can apply CHG directly to the skin and wash gently with a scrungie or a clean washcloth.   Apply the CHG Soap to your body ONLY FROM THE NECK DOWN.  Do not use on open wounds or open sores. Avoid contact with your eyes, ears, mouth and genitals (private parts). Wash Face and genitals (private parts)  with your normal soap.   Wash thoroughly, paying special attention to the area where your surgery will be performed.  Thoroughly rinse your body with warm water from the neck down.  DO NOT shower/wash with your normal soap after using and rinsing off the CHG Soap.  Pat yourself dry with a CLEAN TOWEL.  Wear CLEAN PAJAMAS to bed the night before surgery  Place CLEAN SHEETS on your bed the night before your surgery  DO NOT SLEEP WITH PETS.   Day of Surgery: Take a shower with CHG soap. Do not wear jewelry or makeup Do not wear lotions, powders, perfumes, or deodorant. Do not shave 48 hours prior to surgery.   Do not bring valuables to the hospital. [facility] is not responsible for any belongings or valuables. Do not wear nail polish, gel polish, artificial nails, or any other type of covering on natural nails (fingers and toes) If you have artificial nails or gel coating that need to be removed by a nail salon, please have this removed prior to surgery. Artificial nails or gel coating may interfere with anesthesia's ability to adequately monitor your vital signs. Wear Clean/Comfortable clothing the morning of surgery Remember to brush your teeth WITH YOUR REGULAR TOOTHPASTE.   Please read over the following fact sheets that you were given.    If you received a COVID test during your pre-op visit  it is requested that you wear a mask when out in public, stay away from anyone that may not be feeling well and notify your surgeon if you develop symptoms. If you have been in contact with  anyone that has tested positive in the last 10 days please notify you surgeon.

## 2022-05-03 ENCOUNTER — Encounter (HOSPITAL_COMMUNITY): Payer: Self-pay

## 2022-05-03 ENCOUNTER — Other Ambulatory Visit: Payer: Self-pay

## 2022-05-03 ENCOUNTER — Encounter (HOSPITAL_COMMUNITY)
Admission: RE | Admit: 2022-05-03 | Discharge: 2022-05-03 | Disposition: A | Discharge status: Home / Self Care | Payer: 59 | Source: Ambulatory Visit | Attending: Orthopaedic Surgery | Admitting: Orthopaedic Surgery

## 2022-05-03 VITALS — BP 140/87 | HR 95 | Temp 98.2°F | Resp 17 | Ht 67.0 in | Wt 216.7 lb

## 2022-05-03 DIAGNOSIS — Z01812 Encounter for preprocedural laboratory examination: Secondary | ICD-10-CM | POA: Insufficient documentation

## 2022-05-03 DIAGNOSIS — M1711 Unilateral primary osteoarthritis, right knee: Secondary | ICD-10-CM | POA: Diagnosis not present

## 2022-05-03 DIAGNOSIS — Z01818 Encounter for other preprocedural examination: Secondary | ICD-10-CM

## 2022-05-03 HISTORY — DX: Unspecified osteoarthritis, unspecified site: M19.90

## 2022-05-03 HISTORY — DX: Anxiety disorder, unspecified: F41.9

## 2022-05-03 LAB — COMPREHENSIVE METABOLIC PANEL
ALT: 13 U/L (ref 0–44)
AST: 16 U/L (ref 15–41)
Albumin: 4.1 g/dL (ref 3.5–5.0)
Alkaline Phosphatase: 87 U/L (ref 38–126)
Anion gap: 6 (ref 5–15)
BUN: 14 mg/dL (ref 8–23)
CO2: 25 mmol/L (ref 22–32)
Calcium: 9.3 mg/dL (ref 8.9–10.3)
Chloride: 107 mmol/L (ref 98–111)
Creatinine, Ser: 0.8 mg/dL (ref 0.44–1.00)
GFR, Estimated: >60 mL/min (ref >60)
Glucose, Bld: 124 mg/dL — ABNORMAL HIGH (ref 70–99)
Potassium: 3.9 mmol/L (ref 3.5–5.1)
Sodium: 138 mmol/L (ref 135–145)
Total Bilirubin: 0.7 mg/dL (ref 0.3–1.2)
Total Protein: 7.4 g/dL (ref 6.5–8.1)

## 2022-05-03 LAB — CBC
HCT: 38.9 % (ref 36.0–46.0)
Hemoglobin: 11.2 g/dL — ABNORMAL LOW (ref 12.0–15.0)
MCH: 16.4 pg — ABNORMAL LOW (ref 26.0–34.0)
MCHC: 28.8 g/dL — ABNORMAL LOW (ref 30.0–36.0)
MCV: 57.1 fL — ABNORMAL LOW (ref 80.0–100.0)
Platelets: 281 10*3/uL (ref 150–400)
RBC: 6.81 MIL/uL — ABNORMAL HIGH (ref 3.87–5.11)
RDW: 19.6 % — ABNORMAL HIGH (ref 11.5–15.5)
WBC: 6.2 10*3/uL (ref 4.0–10.5)
nRBC: 0 % (ref 0.0–0.2)

## 2022-05-03 LAB — SURGICAL PCR SCREEN
MRSA, PCR: NEGATIVE
Staphylococcus aureus: NEGATIVE

## 2022-05-03 NOTE — Progress Notes (Signed)
PCP - Dr. Harlan Stains Cardiologist - denies  PPM/ICD - denies   Chest x-ray - 09/04/10 EKG - 04/26/22 (in physical chart) Stress Test - 5-6 years ago per pt ECHO - denies Cardiac Cath - denies  Sleep Study - denies   DM- denies  ASA/Blood Thinner Instructions: n/a   ERAS Protcol - yes PRE-SURGERY Ensure given at PAT  COVID TEST- n/a   Anesthesia review: yes, records in physical chart need review (placed in chart prior to PAT appt)  Patient denies shortness of breath, fever, cough and chest pain at PAT appointment   All instructions explained to the patient, with a verbal understanding of the material. Patient agrees to go over the instructions while at home for a better understanding. The opportunity to ask questions was provided.

## 2022-05-05 ENCOUNTER — Other Ambulatory Visit: Payer: Self-pay | Admitting: Physician Assistant

## 2022-05-05 MED ORDER — OXYCODONE-ACETAMINOPHEN 5-325 MG PO TABS
1.0000 | ORAL_TABLET | Freq: Four times a day (QID) | ORAL | 0 refills | Status: DC | PRN
  Filled 2022-05-05: qty 40, 5d supply, fill #0

## 2022-05-05 MED ORDER — DOCUSATE SODIUM 100 MG PO CAPS
100.0000 mg | ORAL_CAPSULE | Freq: Every day | ORAL | 2 refills | Status: AC | PRN
  Filled 2022-05-05: qty 30, 30d supply, fill #0

## 2022-05-05 MED ORDER — METHOCARBAMOL 750 MG PO TABS
750.0000 mg | ORAL_TABLET | Freq: Two times a day (BID) | ORAL | 2 refills | Status: DC | PRN
  Filled 2022-05-05: qty 20, 10d supply, fill #0
  Filled 2022-05-28 (×2): qty 20, 10d supply, fill #1
  Filled 2022-06-10 (×2): qty 20, 10d supply, fill #2

## 2022-05-05 MED ORDER — ASPIRIN 81 MG PO TBEC
81.0000 mg | DELAYED_RELEASE_TABLET | Freq: Two times a day (BID) | ORAL | 0 refills | Status: AC
  Filled 2022-05-05: qty 84, 42d supply, fill #0

## 2022-05-05 MED ORDER — ONDANSETRON HCL 4 MG PO TABS
4.0000 mg | ORAL_TABLET | Freq: Three times a day (TID) | ORAL | 0 refills | Status: DC | PRN
  Filled 2022-05-05: qty 40, 14d supply, fill #0

## 2022-05-06 ENCOUNTER — Other Ambulatory Visit (HOSPITAL_COMMUNITY): Payer: Self-pay

## 2022-05-06 ENCOUNTER — Encounter: Payer: Self-pay | Admitting: Orthopaedic Surgery

## 2022-05-07 ENCOUNTER — Other Ambulatory Visit (HOSPITAL_COMMUNITY): Payer: Self-pay

## 2022-05-07 ENCOUNTER — Telehealth: Payer: Self-pay | Admitting: Orthopaedic Surgery

## 2022-05-07 NOTE — Telephone Encounter (Signed)
[employer] forms received. To [employer]. ?

## 2022-05-10 MED ORDER — TRANEXAMIC ACID 1000 MG/10ML IV SOLN
2000.0000 mg | INTRAVENOUS | Status: DC
  Filled 2022-05-10: qty 20

## 2022-05-10 NOTE — Progress Notes (Signed)
patient voiced understanding of new arrival time of 0600 Monday 10/9. finish ERAS drink by 0600.

## 2022-05-12 NOTE — Anesthesia Preprocedure Evaluation (Signed)
Anesthesia Evaluation  Patient identified by MRN, date of birth, ID band Patient awake    Reviewed: Allergy & Precautions, NPO status , Patient's Chart, lab work & pertinent test results, reviewed documented beta blocker date and time   History of Anesthesia Complications Negative for: history of anesthetic complications  Airway Mallampati: I  TM Distance: >3 FB Neck ROM: Full    Dental  (+) Edentulous Upper, Missing, Dental Advisory Given, Caps   Pulmonary neg pulmonary ROS,    breath sounds clear to auscultation       Cardiovascular hypertension, Pt. on medications and Pt. on home beta blockers (-) angina Rhythm:Regular Rate:Normal     Neuro/Psych Anxiety negative neurological ROS     GI/Hepatic Neg liver ROS, GERD  Medicated and Controlled,  Endo/Other  Morbid obesity  Renal/GU negative Renal ROS     Musculoskeletal  (+) Arthritis ,   Abdominal (+) + obese,   Peds  Hematology  (+) Blood dyscrasia (Hb 11.2), anemia , thalasemia   Anesthesia Other Findings   Reproductive/Obstetrics                            Anesthesia Physical Anesthesia Plan  ASA: 3  Anesthesia Plan: Spinal   Post-op Pain Management: Regional block* and Tylenol PO (pre-op)*   Induction:   PONV Risk Score and Plan: 2 and Ondansetron and Dexamethasone  Airway Management Planned: Natural Airway and Simple Face Mask  Additional Equipment: None  Intra-op Plan:   Post-operative Plan:   Informed Consent: I have reviewed the patients History and Physical, chart, labs and discussed the procedure including the risks, benefits and alternatives for the proposed anesthesia with the patient or authorized representative who has indicated his/her understanding and acceptance.     Dental advisory given  Plan Discussed with: CRNA and Surgeon  Anesthesia Plan Comments: (Plan routine monitors, SAB with adductor canal  block for post op analgesia)       Anesthesia Quick Evaluation

## 2022-05-13 ENCOUNTER — Ambulatory Visit (HOSPITAL_BASED_OUTPATIENT_CLINIC_OR_DEPARTMENT_OTHER): Payer: 59 | Admitting: Anesthesiology

## 2022-05-13 ENCOUNTER — Encounter (HOSPITAL_COMMUNITY): Payer: Self-pay | Admitting: Orthopaedic Surgery

## 2022-05-13 ENCOUNTER — Observation Stay (HOSPITAL_COMMUNITY): Payer: 59

## 2022-05-13 ENCOUNTER — Observation Stay (HOSPITAL_COMMUNITY)
Admission: RE | Admit: 2022-05-13 | Discharge: 2022-05-14 | Disposition: A | Discharge status: Home Health | Payer: 59 | Source: Ambulatory Visit | Attending: Orthopaedic Surgery | Admitting: Orthopaedic Surgery

## 2022-05-13 ENCOUNTER — Encounter (HOSPITAL_COMMUNITY)
Admission: RE | Disposition: A | Discharge status: Home Health | Payer: Self-pay | Source: Ambulatory Visit | Attending: Orthopaedic Surgery

## 2022-05-13 ENCOUNTER — Ambulatory Visit (HOSPITAL_COMMUNITY): Payer: 59 | Admitting: Physician Assistant

## 2022-05-13 ENCOUNTER — Other Ambulatory Visit: Payer: Self-pay

## 2022-05-13 DIAGNOSIS — Z96651 Presence of right artificial knee joint: Secondary | ICD-10-CM | POA: Diagnosis not present

## 2022-05-13 DIAGNOSIS — M2419 Other articular cartilage disorders, other specified site: Secondary | ICD-10-CM

## 2022-05-13 DIAGNOSIS — M7989 Other specified soft tissue disorders: Secondary | ICD-10-CM | POA: Diagnosis not present

## 2022-05-13 DIAGNOSIS — S83282A Other tear of lateral meniscus, current injury, left knee, initial encounter: Secondary | ICD-10-CM

## 2022-05-13 DIAGNOSIS — Z7982 Long term (current) use of aspirin: Secondary | ICD-10-CM | POA: Diagnosis not present

## 2022-05-13 DIAGNOSIS — M21061 Valgus deformity, not elsewhere classified, right knee: Secondary | ICD-10-CM

## 2022-05-13 DIAGNOSIS — F419 Anxiety disorder, unspecified: Secondary | ICD-10-CM | POA: Diagnosis not present

## 2022-05-13 DIAGNOSIS — I10 Essential (primary) hypertension: Secondary | ICD-10-CM | POA: Insufficient documentation

## 2022-05-13 DIAGNOSIS — M1711 Unilateral primary osteoarthritis, right knee: Principal | ICD-10-CM

## 2022-05-13 DIAGNOSIS — Z79899 Other long term (current) drug therapy: Secondary | ICD-10-CM | POA: Diagnosis not present

## 2022-05-13 DIAGNOSIS — G8918 Other acute postprocedural pain: Secondary | ICD-10-CM | POA: Diagnosis not present

## 2022-05-13 DIAGNOSIS — Z471 Aftercare following joint replacement surgery: Secondary | ICD-10-CM | POA: Diagnosis not present

## 2022-05-13 DIAGNOSIS — M25461 Effusion, right knee: Secondary | ICD-10-CM | POA: Diagnosis not present

## 2022-05-13 HISTORY — PX: TOTAL KNEE ARTHROPLASTY: SHX125

## 2022-05-13 SURGERY — ARTHROPLASTY, KNEE, TOTAL
Anesthesia: Spinal | Site: Knee | Laterality: Right

## 2022-05-13 MED ORDER — IRRISEPT - 450ML BOTTLE WITH 0.05% CHG IN STERILE WATER, USP 99.95% OPTIME
TOPICAL | Status: DC | PRN
  Administered 2022-05-13: 450 mL

## 2022-05-13 MED ORDER — ACETAMINOPHEN 500 MG PO TABS
1000.0000 mg | ORAL_TABLET | Freq: Four times a day (QID) | ORAL | Status: AC
  Administered 2022-05-13 – 2022-05-14 (×4): 1000 mg via ORAL
  Filled 2022-05-13 (×4): qty 2

## 2022-05-13 MED ORDER — SODIUM CHLORIDE 0.9 % IR SOLN
Status: DC | PRN
  Administered 2022-05-13: 3000 mL

## 2022-05-13 MED ORDER — PHENYLEPHRINE HCL-NACL 20-0.9 MG/250ML-% IV SOLN
INTRAVENOUS | Status: DC | PRN
  Administered 2022-05-13: 30 ug/min via INTRAVENOUS

## 2022-05-13 MED ORDER — LACTATED RINGERS IV SOLN: INTRAVENOUS | Status: DC

## 2022-05-13 MED ORDER — HYDROMORPHONE HCL 1 MG/ML IJ SOLN
INTRAMUSCULAR | Status: AC
  Administered 2022-05-13: 0.5 mg via INTRAVENOUS
  Filled 2022-05-13: qty 1

## 2022-05-13 MED ORDER — FERROUS SULFATE 325 (65 FE) MG PO TABS
325.0000 mg | ORAL_TABLET | Freq: Three times a day (TID) | ORAL | Status: DC
  Administered 2022-05-13: 325 mg via ORAL
  Filled 2022-05-13 (×2): qty 1

## 2022-05-13 MED ORDER — BUPIVACAINE IN DEXTROSE 0.75-8.25 % IT SOLN
INTRATHECAL | Status: DC | PRN
  Administered 2022-05-13: 12 mg via INTRATHECAL

## 2022-05-13 MED ORDER — PHENOL 1.4 % MT LIQD: 1.0000 | OROMUCOSAL | Status: DC | PRN

## 2022-05-13 MED ORDER — METHOCARBAMOL 500 MG PO TABS
500.0000 mg | ORAL_TABLET | Freq: Four times a day (QID) | ORAL | Status: DC | PRN
  Administered 2022-05-13 – 2022-05-14 (×3): 500 mg via ORAL
  Filled 2022-05-13 (×2): qty 1

## 2022-05-13 MED ORDER — TRIAMTERENE-HCTZ 75-50 MG PO TABS
1.0000 | ORAL_TABLET | Freq: Every morning | ORAL | Status: DC
  Administered 2022-05-14: 1 via ORAL
  Filled 2022-05-13: qty 1

## 2022-05-13 MED ORDER — TRANEXAMIC ACID 1000 MG/10ML IV SOLN
2000.0000 mg | INTRAVENOUS | Status: DC
  Filled 2022-05-13: qty 20

## 2022-05-13 MED ORDER — MEPERIDINE HCL 25 MG/ML IJ SOLN: 6.2500 mg | INTRAMUSCULAR | Status: DC | PRN

## 2022-05-13 MED ORDER — OXYCODONE HCL 5 MG PO TABS
ORAL_TABLET | ORAL | Status: AC
  Filled 2022-05-13: qty 1

## 2022-05-13 MED ORDER — HYDROMORPHONE HCL 1 MG/ML IJ SOLN
0.5000 mg | INTRAMUSCULAR | Status: DC | PRN
  Filled 2022-05-13: qty 1

## 2022-05-13 MED ORDER — ONDANSETRON HCL 4 MG PO TABS: 4.0000 mg | ORAL_TABLET | Freq: Four times a day (QID) | ORAL | Status: DC | PRN

## 2022-05-13 MED ORDER — BUPIVACAINE-MELOXICAM ER 400-12 MG/14ML IJ SOLN
INTRAMUSCULAR | Status: AC
  Filled 2022-05-13: qty 1

## 2022-05-13 MED ORDER — KETOROLAC TROMETHAMINE 15 MG/ML IJ SOLN
15.0000 mg | Freq: Four times a day (QID) | INTRAMUSCULAR | Status: AC
  Administered 2022-05-13 – 2022-05-14 (×4): 15 mg via INTRAVENOUS
  Filled 2022-05-13 (×3): qty 1

## 2022-05-13 MED ORDER — ACETAMINOPHEN 325 MG PO TABS: 325.0000 mg | ORAL_TABLET | Freq: Four times a day (QID) | ORAL | Status: DC | PRN

## 2022-05-13 MED ORDER — METHOCARBAMOL 1000 MG/10ML IJ SOLN: 500.0000 mg | Freq: Four times a day (QID) | INTRAVENOUS | Status: DC | PRN

## 2022-05-13 MED ORDER — DEXAMETHASONE SODIUM PHOSPHATE 10 MG/ML IJ SOLN
INTRAMUSCULAR | Status: AC
  Filled 2022-05-13: qty 1

## 2022-05-13 MED ORDER — ROPIVACAINE HCL 7.5 MG/ML IJ SOLN
INTRAMUSCULAR | Status: DC | PRN
  Administered 2022-05-13: 20 mL via PERINEURAL

## 2022-05-13 MED ORDER — KETOROLAC TROMETHAMINE 15 MG/ML IJ SOLN
INTRAMUSCULAR | Status: AC
  Filled 2022-05-13: qty 1

## 2022-05-13 MED ORDER — TRANEXAMIC ACID-NACL 1000-0.7 MG/100ML-% IV SOLN
1000.0000 mg | Freq: Once | INTRAVENOUS | Status: AC
  Administered 2022-05-13: 1000 mg via INTRAVENOUS
  Filled 2022-05-13: qty 100

## 2022-05-13 MED ORDER — MIDAZOLAM HCL 2 MG/2ML IJ SOLN: 0.5000 mg | Freq: Once | INTRAMUSCULAR | Status: DC | PRN

## 2022-05-13 MED ORDER — OXYCODONE HCL ER 10 MG PO T12A
10.0000 mg | EXTENDED_RELEASE_TABLET | Freq: Two times a day (BID) | ORAL | Status: DC
  Administered 2022-05-13 – 2022-05-14 (×2): 10 mg via ORAL
  Filled 2022-05-13 (×2): qty 1

## 2022-05-13 MED ORDER — DOCUSATE SODIUM 100 MG PO CAPS
100.0000 mg | ORAL_CAPSULE | Freq: Two times a day (BID) | ORAL | Status: DC
  Administered 2022-05-13 – 2022-05-14 (×2): 100 mg via ORAL
  Filled 2022-05-13 (×2): qty 1

## 2022-05-13 MED ORDER — OXYCODONE HCL 5 MG PO TABS: 10.0000 mg | ORAL_TABLET | ORAL | Status: DC | PRN

## 2022-05-13 MED ORDER — ASPIRIN 81 MG PO CHEW
81.0000 mg | CHEWABLE_TABLET | Freq: Two times a day (BID) | ORAL | Status: DC
  Administered 2022-05-13 – 2022-05-14 (×2): 81 mg via ORAL
  Filled 2022-05-13 (×2): qty 1

## 2022-05-13 MED ORDER — MIDAZOLAM HCL 2 MG/2ML IJ SOLN
INTRAMUSCULAR | Status: AC
  Filled 2022-05-13: qty 2

## 2022-05-13 MED ORDER — OXYCODONE HCL 5 MG PO TABS
5.0000 mg | ORAL_TABLET | Freq: Once | ORAL | Status: AC | PRN
  Administered 2022-05-13: 5 mg via ORAL

## 2022-05-13 MED ORDER — VANCOMYCIN HCL 1000 MG IV SOLR
INTRAVENOUS | Status: AC
  Filled 2022-05-13: qty 20

## 2022-05-13 MED ORDER — CEFAZOLIN SODIUM-DEXTROSE 2-4 GM/100ML-% IV SOLN
2.0000 g | Freq: Four times a day (QID) | INTRAVENOUS | Status: AC
  Administered 2022-05-13 (×2): 2 g via INTRAVENOUS
  Filled 2022-05-13 (×2): qty 100

## 2022-05-13 MED ORDER — PROPOFOL 500 MG/50ML IV EMUL
INTRAVENOUS | Status: DC | PRN
  Administered 2022-05-13: 50 ug/kg/min via INTRAVENOUS

## 2022-05-13 MED ORDER — ACETAMINOPHEN 500 MG PO TABS: 1000.0000 mg | ORAL_TABLET | Freq: Once | ORAL | Status: AC

## 2022-05-13 MED ORDER — HYDROMORPHONE HCL 1 MG/ML IJ SOLN: 0.2500 mg | INTRAMUSCULAR | Status: DC | PRN

## 2022-05-13 MED ORDER — TRANEXAMIC ACID-NACL 1000-0.7 MG/100ML-% IV SOLN
1000.0000 mg | INTRAVENOUS | Status: AC
  Administered 2022-05-13: 1000 mg via INTRAVENOUS

## 2022-05-13 MED ORDER — VANCOMYCIN HCL 1000 MG IV SOLR
INTRAVENOUS | Status: DC | PRN
  Administered 2022-05-13: 1000 mg via TOPICAL

## 2022-05-13 MED ORDER — ONDANSETRON HCL 4 MG/2ML IJ SOLN
INTRAMUSCULAR | Status: AC
  Filled 2022-05-13: qty 2

## 2022-05-13 MED ORDER — OXYCODONE HCL 5 MG PO TABS
5.0000 mg | ORAL_TABLET | ORAL | Status: DC | PRN
  Administered 2022-05-14: 10 mg via ORAL
  Filled 2022-05-13: qty 2

## 2022-05-13 MED ORDER — FENTANYL CITRATE (PF) 100 MCG/2ML IJ SOLN
INTRAMUSCULAR | Status: AC
  Filled 2022-05-13: qty 2

## 2022-05-13 MED ORDER — DEXAMETHASONE SODIUM PHOSPHATE 10 MG/ML IJ SOLN
10.0000 mg | Freq: Once | INTRAMUSCULAR | Status: AC
  Administered 2022-05-14: 10 mg via INTRAVENOUS
  Filled 2022-05-13: qty 1

## 2022-05-13 MED ORDER — OXYCODONE HCL 5 MG/5ML PO SOLN: 5.0000 mg | Freq: Once | ORAL | Status: AC | PRN

## 2022-05-13 MED ORDER — BUPIVACAINE-MELOXICAM ER 400-12 MG/14ML IJ SOLN
INTRAMUSCULAR | Status: DC | PRN
  Administered 2022-05-13: 400 mg

## 2022-05-13 MED ORDER — MENTHOL 3 MG MT LOZG: 1.0000 | LOZENGE | OROMUCOSAL | Status: DC | PRN

## 2022-05-13 MED ORDER — FENTANYL CITRATE (PF) 250 MCG/5ML IJ SOLN
INTRAMUSCULAR | Status: AC
  Filled 2022-05-13: qty 5

## 2022-05-13 MED ORDER — MIDAZOLAM HCL 2 MG/2ML IJ SOLN
INTRAMUSCULAR | Status: DC | PRN
  Administered 2022-05-13: 2 mg via INTRAVENOUS

## 2022-05-13 MED ORDER — METOPROLOL SUCCINATE ER 50 MG PO TB24
200.0000 mg | ORAL_TABLET | Freq: Every day | ORAL | Status: DC
  Administered 2022-05-14: 200 mg via ORAL
  Filled 2022-05-13: qty 4

## 2022-05-13 MED ORDER — ACETAMINOPHEN 500 MG PO TABS
ORAL_TABLET | ORAL | Status: AC
  Administered 2022-05-13: 1000 mg via ORAL
  Filled 2022-05-13: qty 2

## 2022-05-13 MED ORDER — CEFAZOLIN SODIUM-DEXTROSE 2-4 GM/100ML-% IV SOLN
2.0000 g | INTRAVENOUS | Status: AC
  Administered 2022-05-13: 2 g via INTRAVENOUS
  Filled 2022-05-13: qty 100

## 2022-05-13 MED ORDER — SODIUM CHLORIDE 0.9 % IV SOLN: INTRAVENOUS | Status: DC

## 2022-05-13 MED ORDER — ONDANSETRON HCL 4 MG/2ML IJ SOLN: 4.0000 mg | Freq: Four times a day (QID) | INTRAMUSCULAR | Status: DC | PRN

## 2022-05-13 MED ORDER — CHLORHEXIDINE GLUCONATE 0.12 % MT SOLN
OROMUCOSAL | Status: AC
  Administered 2022-05-13: 15 mL
  Filled 2022-05-13: qty 15

## 2022-05-13 MED ORDER — DEXAMETHASONE SODIUM PHOSPHATE 10 MG/ML IJ SOLN
INTRAMUSCULAR | Status: DC | PRN
  Administered 2022-05-13: 10 mg via INTRAVENOUS

## 2022-05-13 MED ORDER — ALPRAZOLAM 0.5 MG PO TABS
0.5000 mg | ORAL_TABLET | Freq: Every day | ORAL | Status: DC | PRN
  Administered 2022-05-13: 0.5 mg via ORAL
  Filled 2022-05-13: qty 1

## 2022-05-13 MED ORDER — METHOCARBAMOL 500 MG PO TABS
ORAL_TABLET | ORAL | Status: AC
  Filled 2022-05-13: qty 1

## 2022-05-13 MED ORDER — TRANEXAMIC ACID-NACL 1000-0.7 MG/100ML-% IV SOLN
INTRAVENOUS | Status: AC
  Filled 2022-05-13: qty 100

## 2022-05-13 MED ORDER — ONDANSETRON HCL 4 MG/2ML IJ SOLN
INTRAMUSCULAR | Status: DC | PRN
  Administered 2022-05-13: 4 mg via INTRAVENOUS

## 2022-05-13 MED ORDER — POVIDONE-IODINE 10 % EX SWAB
2.0000 | Freq: Once | CUTANEOUS | Status: AC
  Administered 2022-05-13: 2 via TOPICAL

## 2022-05-13 MED ORDER — TRANEXAMIC ACID 1000 MG/10ML IV SOLN
INTRAVENOUS | Status: DC | PRN
  Administered 2022-05-13: 2000 mg via TOPICAL

## 2022-05-13 MED ORDER — PROMETHAZINE HCL 25 MG/ML IJ SOLN: 6.2500 mg | INTRAMUSCULAR | Status: DC | PRN

## 2022-05-13 MED ORDER — FENTANYL CITRATE (PF) 250 MCG/5ML IJ SOLN
INTRAMUSCULAR | Status: DC | PRN
  Administered 2022-05-13: 100 ug via INTRAVENOUS

## 2022-05-13 MED ORDER — 0.9 % SODIUM CHLORIDE (POUR BTL) OPTIME
TOPICAL | Status: DC | PRN
  Administered 2022-05-13: 1000 mL

## 2022-05-13 SURGICAL SUPPLY — 86 items
ADH SKN CLS APL DERMABOND .7 (GAUZE/BANDAGES/DRESSINGS) ×1
ADH SKN CLS LQ APL DERMABOND (GAUZE/BANDAGES/DRESSINGS) ×1
ALCOHOL 70% 16 OZ (MISCELLANEOUS) ×2 IMPLANT
BAG COUNTER SPONGE SURGICOUNT (BAG) IMPLANT
BAG DECANTER FOR FLEXI CONT (MISCELLANEOUS) ×2 IMPLANT
BAG SPNG CNTER NS LX DISP (BAG)
BANDAGE ESMARK 6X9 LF (GAUZE/BANDAGES/DRESSINGS) IMPLANT
BLADE SAG 18X100X1.27 (BLADE) ×2 IMPLANT
BLADE SAW SAG 90X13X1.27 (BLADE) IMPLANT
BNDG CMPR 9X6 STRL LF SNTH (GAUZE/BANDAGES/DRESSINGS)
BNDG ESMARK 6X9 LF (GAUZE/BANDAGES/DRESSINGS)
BOWL SMART MIX CTS (DISPOSABLE) ×2 IMPLANT
CLSR STERI-STRIP ANTIMIC 1/2X4 (GAUZE/BANDAGES/DRESSINGS) ×4 IMPLANT
COMP FEM PS KNEE NRW 8 RT (Joint) ×1 IMPLANT
COMP PATELLAR 10X35 METAL (Joint) ×1 IMPLANT
COMP TIB PS KNEE 0D E RT (Joint) ×1 IMPLANT
COMPONENT FEM PS KNEE NRW 8 RT (Joint) IMPLANT
COMPONENT PATELLAR 10X35 METAL (Joint) IMPLANT
COMPONET TIB PS KNEE 0D E RT (Joint) IMPLANT
COOLER ICEMAN CLASSIC (MISCELLANEOUS) ×2 IMPLANT
COVER SURGICAL LIGHT HANDLE (MISCELLANEOUS) ×2 IMPLANT
CUFF TOURN SGL QUICK 34 (TOURNIQUET CUFF) ×1
CUFF TOURN SGL QUICK 42 (TOURNIQUET CUFF) IMPLANT
CUFF TRNQT CYL 34X4.125X (TOURNIQUET CUFF) ×2 IMPLANT
DERMABOND ADVANCED .7 DNX12 (GAUZE/BANDAGES/DRESSINGS) ×2 IMPLANT
DERMABOND ADVANCED .7 DNX6 (GAUZE/BANDAGES/DRESSINGS) IMPLANT
DRAPE EXTREMITY T 121X128X90 (DISPOSABLE) ×2 IMPLANT
DRAPE HALF SHEET 40X57 (DRAPES) ×2 IMPLANT
DRAPE INCISE IOBAN 66X45 STRL (DRAPES) ×2 IMPLANT
DRAPE ORTHO SPLIT 77X108 STRL (DRAPES) ×1
DRAPE POUCH INSTRU U-SHP 10X18 (DRAPES) ×2 IMPLANT
DRAPE SURG ORHT 6 SPLT 77X108 (DRAPES) ×4 IMPLANT
DRAPE U-SHAPE 47X51 STRL (DRAPES) ×4 IMPLANT
DRSG AQUACEL AG ADV 3.5X10 (GAUZE/BANDAGES/DRESSINGS) ×2 IMPLANT
DURAPREP 26ML APPLICATOR (WOUND CARE) ×6 IMPLANT
ELECT CAUTERY BLADE 6.4 (BLADE) ×2 IMPLANT
ELECT REM PT RETURN 9FT ADLT (ELECTROSURGICAL) ×1
ELECTRODE REM PT RTRN 9FT ADLT (ELECTROSURGICAL) ×2 IMPLANT
GLOVE BIOGEL PI IND STRL 7.0 (GLOVE) ×4 IMPLANT
GLOVE BIOGEL PI IND STRL 7.5 (GLOVE) ×10 IMPLANT
GLOVE ECLIPSE 7.0 STRL STRAW (GLOVE) ×6 IMPLANT
GLOVE SKINSENSE STRL SZ7.5 (GLOVE) ×6 IMPLANT
GLOVE SURG SYN 7.5  E (GLOVE) ×2
GLOVE SURG SYN 7.5 E (GLOVE) ×2 IMPLANT
GLOVE SURG SYN 7.5 PF PI (GLOVE) ×4 IMPLANT
GLOVE SURG UNDER LTX SZ7.5 (GLOVE) ×4 IMPLANT
GLOVE SURG UNDER POLY LF SZ7 (GLOVE) ×4 IMPLANT
GOWN STRL REIN XL XLG (GOWN DISPOSABLE) ×2 IMPLANT
GOWN STRL REUS W/ TWL LRG LVL3 (GOWN DISPOSABLE) ×2 IMPLANT
GOWN STRL REUS W/TWL LRG LVL3 (GOWN DISPOSABLE) ×2
HANDPIECE INTERPULSE COAX TIP (DISPOSABLE) ×1
HDLS TROCR DRIL PIN KNEE 75 (PIN) ×4
HOOD PEEL AWAY FLYTE STAYCOOL (MISCELLANEOUS) ×4 IMPLANT
INSERT TIB AS PERS 8-11X13 (Insert) IMPLANT
JET LAVAGE IRRISEPT WOUND (IRRIGATION / IRRIGATOR) ×1
KIT BASIN OR (CUSTOM PROCEDURE TRAY) ×2 IMPLANT
KIT TURNOVER KIT B (KITS) ×2 IMPLANT
LAVAGE JET IRRISEPT WOUND (IRRIGATION / IRRIGATOR) ×2 IMPLANT
MANIFOLD NEPTUNE II (INSTRUMENTS) ×2 IMPLANT
MARKER SKIN DUAL TIP RULER LAB (MISCELLANEOUS) ×4 IMPLANT
NDL SPNL 18GX3.5 QUINCKE PK (NEEDLE) ×2 IMPLANT
NEEDLE SPNL 18GX3.5 QUINCKE PK (NEEDLE) ×1 IMPLANT
NS IRRIG 1000ML POUR BTL (IV SOLUTION) ×2 IMPLANT
PACK TOTAL JOINT (CUSTOM PROCEDURE TRAY) ×2 IMPLANT
PAD ARMBOARD 7.5X6 YLW CONV (MISCELLANEOUS) ×4 IMPLANT
PAD COLD SHLDR WRAP-ON (PAD) ×2 IMPLANT
PIN DRILL HDLS TROCAR 75 4PK (PIN) IMPLANT
SAW OSC TIP CART 19.5X105X1.3 (SAW) ×2 IMPLANT
SCREW FEMALE HEX FIX 25X2.5 (ORTHOPEDIC DISPOSABLE SUPPLIES) IMPLANT
SET HNDPC FAN SPRY TIP SCT (DISPOSABLE) ×2 IMPLANT
STAPLER VISISTAT 35W (STAPLE) IMPLANT
SUCTION FRAZIER HANDLE 10FR (MISCELLANEOUS) ×1
SUCTION TUBE FRAZIER 10FR DISP (MISCELLANEOUS) ×2 IMPLANT
SUT ETHILON 2 0 FS 18 (SUTURE) IMPLANT
SUT MNCRL AB 3-0 PS2 27 (SUTURE) IMPLANT
SUT VIC AB 0 CT1 27 (SUTURE) ×2
SUT VIC AB 0 CT1 27XBRD ANBCTR (SUTURE) ×4 IMPLANT
SUT VIC AB 1 CTX 27 (SUTURE) ×6 IMPLANT
SUT VIC AB 2-0 CT1 27 (SUTURE) ×4
SUT VIC AB 2-0 CT1 TAPERPNT 27 (SUTURE) ×8 IMPLANT
SYR 50ML LL SCALE MARK (SYRINGE) ×4 IMPLANT
TOWEL GREEN STERILE (TOWEL DISPOSABLE) ×2 IMPLANT
TOWEL GREEN STERILE FF (TOWEL DISPOSABLE) ×2 IMPLANT
TRAY CATH 16FR W/PLASTIC CATH (SET/KITS/TRAYS/PACK) IMPLANT
UNDERPAD 30X36 HEAVY ABSORB (UNDERPADS AND DIAPERS) ×2 IMPLANT
YANKAUER SUCT BULB TIP NO VENT (SUCTIONS) ×4 IMPLANT

## 2022-05-13 NOTE — Evaluation (Signed)
Physical Therapy Evaluation Patient Details Name: Heather Diaz MRN: [id number] DOB: 08-Feb-1961 Today's Date: 05/13/2022  History of Present Illness  Pt is a 61 y/o female s/p R TKA. PMH includes HTN.  Clinical Impression  Pt admitted secondary to problem above with deficits below. Pt with increased pain and only tolerated sitting EOB this session. Reviewed knee precautions with pt. Reports grandson can assist as needed. Will continue to follow acutely.        Recommendations for follow up therapy are one component of a multi-disciplinary discharge planning process, led by the attending physician.  Recommendations may be updated based on patient status, additional functional criteria and insurance authorization.  Follow Up Recommendations Follow physician's recommendations for discharge plan and follow up therapies      Assistance Recommended at Discharge Intermittent Supervision/Assistance  Patient can return home with the following  Assistance with cooking/housework;Assist for transportation;Help with stairs or ramp for entrance    Equipment Recommendations None recommended by PT  Recommendations for Other Services       Functional Status Assessment Patient has had a recent decline in their functional status and demonstrates the ability to make significant improvements in function in a reasonable and predictable amount of time.     Precautions / Restrictions Precautions Precautions: Knee Precaution Booklet Issued: No Precaution Comments: Verbally reviewed knee precautions Restrictions Weight Bearing Restrictions: Yes RLE Weight Bearing: Weight bearing as tolerated      Mobility  Bed Mobility Overal bed mobility: Needs Assistance Bed Mobility: Supine to Sit, Sit to Supine     Supine to sit: Min assist Sit to supine: Min assist   General bed mobility comments: Pt requiring min A for RLE assist. Pt with incresaed pain in R knee, so further mobility deferred     Transfers                        Ambulation/Gait                  Stairs            Wheelchair Mobility    Modified Rankin (Stroke Patients Only)       Balance Overall balance assessment: Needs assistance Sitting-balance support: No upper extremity supported Sitting balance-Leahy Scale: Good                                       Pertinent Vitals/Pain Pain Assessment Pain Assessment: 0-10 Pain Score: 8  Pain Location: R knee Pain Descriptors / Indicators: Guarding, Grimacing Pain Intervention(s): Limited activity within patient's tolerance, Monitored during session, Repositioned    Home Living Family/patient expects to be discharged to:: Private residence Living Arrangements: Other relatives (grandson) Available Help at Discharge: Family Type of Home: House Home Access: Stairs to enter Entrance Stairs-Rails: Right Entrance Stairs-Number of Steps: 4-5   Home Layout: One level Home Equipment: Teacher, English as a foreign language (2 wheels);Crutches;Shower seat;Toilet riser      Prior Function Prior Level of Function : Independent/Modified Independent             Mobility Comments: Used crutch if she was having a bad day       Hand Dominance        Extremity/Trunk Assessment   Upper Extremity Assessment Upper Extremity Assessment: Defer to OT evaluation    Lower Extremity Assessment Lower Extremity Assessment: RLE deficits/detail RLE Deficits / Details: Deficits consistent  with post op pain and weakness.    Cervical / Trunk Assessment Cervical / Trunk Assessment: Normal  Communication   Communication: No difficulties  Cognition Arousal/Alertness: Awake/alert Behavior During Therapy: WFL for tasks assessed/performed Overall Cognitive Status: Within Functional Limits for tasks assessed                                          General Comments      Exercises     Assessment/Plan    PT  Assessment Patient needs continued PT services  PT Problem List Decreased strength;Decreased range of motion;Decreased activity tolerance;Decreased balance;Decreased mobility;Pain       PT Treatment Interventions DME instruction;Gait training;Stair training;Functional mobility training;Therapeutic activities;Balance training;Therapeutic exercise;Patient/family education    PT Goals (Current goals can be found in the Care Plan section)  Acute Rehab PT Goals Patient Stated Goal: to go home PT Goal Formulation: With patient Time For Goal Achievement: 05/27/22 Potential to Achieve Goals: Good    Frequency 7X/week     Co-evaluation               AM-PAC PT "6 Clicks" Mobility  Outcome Measure Help needed turning from your back to your side while in a flat bed without using bedrails?: A Little Help needed moving from lying on your back to sitting on the side of a flat bed without using bedrails?: A Little Help needed moving to and from a bed to a chair (including a wheelchair)?: A Lot Help needed standing up from a chair using your arms (e.g., wheelchair or bedside chair)?: A Lot Help needed to walk in hospital room?: A Lot Help needed climbing 3-5 steps with a railing? : A Lot 6 Click Score: 14    End of Session   Activity Tolerance: Patient limited by pain Patient left: in bed;with call bell/phone within reach Nurse Communication: Mobility status PT Visit Diagnosis: Other abnormalities of gait and mobility (R26.89);Pain Pain - Right/Left: Right Pain - part of body: Knee    Time: 4540-9811 PT Time Calculation (min) (ACUTE ONLY): 12 min   Charges:   PT Evaluation $PT Eval Low Complexity: 1 Low          Heather Diaz, DPT  Acute Rehabilitation Services  Office: [phone]   Heather Diaz 05/13/2022, 6:41 PM

## 2022-05-13 NOTE — Discharge Instructions (Signed)

## 2022-05-13 NOTE — Op Note (Signed)
Total Knee Arthroplasty Procedure Note  Preoperative diagnosis: Right knee osteoarthritis  Postoperative diagnosis:same  Operative findings: Good bone quality Valgus deformity Complete loss of articular cartilage from lateral compartment  Operative procedure: Right total knee arthroplasty. CPT [phone]  Surgeon: N. Glee Arvin, MD  Assist: Oneal Grout, PA-C; necessary for the timely completion of procedure and due to complexity of procedure.  Anesthesia: Spinal, regional  Tourniquet time: see anesthesia record  Implants used: Zimmer persona pressfit Femur: CR 8 narrow Tibia: E Patella: 35 mm Polyethylene: 13 mm, medial congruent  Indication: Heather Diaz is a 61 y.o. year old female with a history of knee pain. Having failed conservative management, the patient elected to proceed with a total knee arthroplasty.  We have reviewed the risk and benefits of the surgery and they elected to proceed after voicing understanding.  Procedure:  After informed consent was obtained and understanding of the risk were voiced including but not limited to bleeding, infection, damage to surrounding structures including nerves and vessels, blood clots, leg length inequality and the failure to achieve desired results, the operative extremity was marked with verbal confirmation of the patient in the holding area.   The patient was then brought to the operating room and transported to the operating room table in the supine position.  A tourniquet was applied to the operative extremity around the upper thigh. The operative limb was then prepped and draped in the usual sterile fashion and preoperative antibiotics were administered.  A time out was performed prior to the start of surgery confirming the correct extremity, preoperative antibiotic administration, as well as team members, implants and instruments available for the case. Correct surgical site was also confirmed with preoperative  radiographs. The limb was then elevated for exsanguination and the tourniquet was inflated. A midline incision was made and a standard medial parapatellar approach was performed.  The patella was prepared and sized to a 35 mm.  A cover was placed on the patella for protection from retractors.  We then turned our attention to the femur. Posterior cruciate ligament was sacrificed. Start site was drilled in the femur and the intramedullary distal femoral cutting guide was placed, set at 5 degrees valgus, taking 10 mm of distal resection. The distal cut was made. Osteophytes were then removed.   Next, the proximal tibial cutting guide was placed with appropriate slope, varus/valgus alignment and depth of resection. The proximal tibial cut was made taking 6 mm off the low side. Gap blocks were then used to assess the extension gap and alignment, and appropriate soft tissue releases were performed. Attention was turned back to the femur, which was sized using the sizing guide to a size 8 narrow. Appropriate rotation of the femoral component was determined using epicondylar axis, Whiteside's line, and assessing the flexion gap under ligament tension. The appropriate size 4-in-1 cutting block was placed and cuts were made.  Posterior femoral osteophytes and uncapped bone were then removed with the curved osteotome.  Trial components were placed, and stability was checked in full extension, mid-flexion, and deep flexion. Proper tibial rotation was determined and marked.  The patella tracked well without a lateral release. Trial components were then removed and tibial preparation performed.  The tibia was sized for a size E component.  The bony surfaces were irrigated with a pulse lavage and then dried. Final components were placed.  The stability of the construct was re-evaluated throughout a range of motion and found to be acceptable. The trial  liner was removed, the knee was copiously irrigated, and the knee was  re-evaluated for any excess bone debris. The real polyethylene liner, 13 mm thick, was inserted and checked to ensure the locking mechanism had engaged appropriately. The tourniquet was deflated and hemostasis was achieved. The wound was irrigated with normal saline.  One gram of vancomycin powder was placed in the surgical bed.  Topical 0.25% bupivacaine and meloxicam was placed in the joint for postoperative pain.  Capsular closure was performed with a #1 vicryl, subcutaneous fat closed with a 0 vicryl suture, then subcutaneous tissue closed with interrupted 2.0 vicryl suture. The skin was then closed with a 2.0 nylon and dermabond. A sterile dressing was applied.  The patient was awakened in the operating room and taken to recovery in stable condition. All sponge, needle, and instrument counts were correct at the end of the case.  Tawanna Cooler was necessary for opening, closing, retracting, limb positioning and overall facilitation and completion of the surgery.  Position: supine  Complications: none.  Time Out: performed   Drains/Packing: none  Estimated blood loss: minimal  Returned to Recovery Room: in good condition.   Antibiotics: yes   Mechanical VTE (DVT) Prophylaxis: sequential compression devices, TED thigh-high  Chemical VTE (DVT) Prophylaxis: aspirin  Fluid Replacement  Crystalloid: see anesthesia record Blood: none  FFP: none   Specimens Removed: 1 to pathology   Sponge and Instrument Count Correct? yes   PACU: portable radiograph - knee AP and Lateral   Plan/RTC: Return in 2 weeks for wound check.   Weight Bearing/Load Lower Extremity: full   Implant Name Type Inv. Item Serial No. Manufacturer Lot No. LRB No. Used Action  COMP PATELLAR 10X35 METAL - [id number] Joint COMP PATELLAR 10X35 METAL  [employer](ORTH,TRAU,BIO,SG) [id number] Right 1 Implanted  COMP TIB PS KNEE 0D E RT - [id number] Joint COMP TIB PS KNEE 0D E RT  [employer](ORTH,TRAU,BIO,SG) [id number]  Right 1 Implanted  COMP FEM PS KNEE NRW 8 RT - [id number] Joint COMP FEM PS KNEE NRW 8 RT  ZIMMER [employer]ON(ORTH,TRAU,BIO,SG) [id number] Right 1 Implanted  INSERT TIB AS PERS 8-11X13 - [id number] Insert INSERT TIB AS PERS 8-11X13  ZIMMER [employer]ON(ORTH,TRAU,BIO,SG) [id number] Right 1 Implanted    N. Eduard Roux, MD [facility] 10:45 AM

## 2022-05-13 NOTE — Anesthesia Procedure Notes (Signed)
Spinal  End time: 05/13/2022 9:12 AM Staffing Performed: anesthesiologist  Anesthesiologist: Annye Asa, MD Performed by: Annye Asa, MD Authorized by: Annye Asa, MD   Preanesthetic Checklist Completed: patient identified, IV checked, site marked, risks and benefits discussed, surgical consent, monitors and equipment checked, pre-op evaluation and timeout performed Spinal Block Patient position: sitting Prep: DuraPrep and site prepped and draped Patient monitoring: blood pressure, continuous pulse ox, cardiac monitor and heart rate Approach: midline Location: L3-4 Injection technique: single-shot Needle Needle type: Pencan and Introducer  Needle gauge: 24 G Needle length: 9 cm Assessment Events: CSF return Additional Notes Pt identified in Operating room.  Monitors applied. Working IV access confirmed. Sterile prep, drape lumbar spine.  1% lido local L 3,4.  #24ga Pencan into clear CSF L 3,4.  12mg  0.75% Bupivacaine with dextrose injected with asp CSF beginning and end of injection.  Patient asymptomatic, VSS, no heme aspirated, tolerated well.  Jenita Seashore, MD

## 2022-05-13 NOTE — H&P (Signed)
PREOPERATIVE H&P  Chief Complaint: RIGHT KNEE DEGENERATIVE JOINT DISEASE  HPI: Heather Diaz is a 61 y.o. female who presents for surgical treatment of RIGHT KNEE DEGENERATIVE JOINT DISEASE.  She denies any changes in medical history.  Past Medical History:  Diagnosis Date   Anemia    thalacemia   Anxiety    Arthritis    Hypertension    Past Surgical History:  Procedure Laterality Date   ABDOMINAL HYSTERECTOMY     KNEE ARTHROSCOPY WITH MEDIAL MENISECTOMY Left 12/22/2020   Procedure: LEFT KNEE ARTHROSCOPY WITH PARTIAL MEDIAL MENISECTOMY;  Surgeon: Tarry Kos, MD;  Location: [facility];  Service: Orthopedics;  Laterality: Left;   KNEE ARTHROSCOPY WITH MEDIAL MENISECTOMY Right 10/24/2021   Procedure: right knee arthroscopy partial medial and partial lateral meniscectomy and synovectomy;  Surgeon: Tarry Kos, MD;  Location: [facility];  Service: Orthopedics;  Laterality: Right;   REDUCTION MAMMAPLASTY Bilateral    Social History   Socioeconomic History   Marital status: Divorced    Spouse name: Not on file   Number of children: 2   Years of education: Not on file   Highest education level: Not on file  Occupational History   Not on file  Tobacco Use   Smoking status: Never   Smokeless tobacco: Never  Vaping Use   Vaping Use: Never used  Substance and Sexual Activity   Alcohol use: Yes    Alcohol/week: 3.0 standard drinks of alcohol    Types: 3 Glasses of wine per week    Comment: maybe 1 glass of wine  2-3 times a week   Drug use: Never   Sexual activity: Not Currently    Birth control/protection: Surgical  Other Topics Concern   Not on file  Social History Narrative   Not on file   Social Determinants of Health   Financial Resource Strain: Not on file  Food Insecurity: Not on file  Transportation Needs: Not on file  Physical Activity: Not on file  Stress: Not on file  Social Connections: Not on file   No family history  on file. Allergies  Allergen Reactions   Reglan [Metoclopramide] Other (See Comments)    confusion   Prior to Admission medications   Medication Sig Start Date End Date Taking? Authorizing Provider  ALPRAZolam Prudy Feeler) 0.5 MG tablet Take 1 tablet (0.5 mg total) by mouth daily as needed for anxiety 04/18/22  Yes   cyanocobalamin 2000 MCG tablet Take 2,000 mcg by mouth daily.   Yes [provider]  cyclobenzaprine (FLEXERIL) 5 MG tablet Take 1 tablet (5 mg total) by mouth at bedtime as needed for muscle spasms. Patient taking differently: Take 5 mg by mouth every other day. 02/12/22  Yes Tarry Kos, MD  diclofenac Sodium (VOLTAREN) 1 % GEL Apply 2 g topically daily as needed (pain).   Yes [provider]  estradiol (VIVELLE-DOT) 0.05 MG/24HR patch Apply 1 patch onto skin twice a week 04/18/22  Yes   metoprolol (TOPROL XL) 200 MG 24 hr tablet Take 1 tablet (200 mg total) by mouth daily. 04/18/22  Yes   Multiple Vitamins-Minerals (ADULT GUMMY) CHEW Chew 1 each by mouth daily.   Yes [provider]  triamterene-hydrochlorothiazide (MAXZIDE) 75-50 MG tablet Take 1 tablet by mouth every morning. 04/18/22  Yes   zolpidem (AMBIEN) 10 MG tablet Take 1/2-1 tablet (5-10 mg total) by mouth at bedtime as needed. Patient taking differently: Take 10 mg by  mouth at bedtime. 04/18/22  Yes   aspirin EC 81 MG tablet Take 1 tablet (81 mg total) by mouth 2 (two) times daily. To be taken after surgery to prevent blood clots 05/05/22 05/05/23  Aundra Dubin, PA-C  celecoxib (CELEBREX) 200 MG capsule Take 1 capsule (200 mg total) by mouth 2 (two) times daily. Patient not taking: Reported on 04/29/2022 02/12/22   Leandrew Koyanagi, MD  diclofenac (VOLTAREN) 75 MG EC tablet Take 1 tablet (75 mg total) by mouth 2 (two) times daily. Patient not taking: Reported on 04/29/2022 09/24/21   Aundra Dubin, PA-C  docusate sodium (COLACE) 100 MG capsule Take 1 capsule (100 mg total) by mouth daily as needed.  05/05/22 05/05/23  Aundra Dubin, PA-C  methocarbamol (ROBAXIN-750) 750 MG tablet Take 1 tablet (750 mg total) by mouth 2 (two) times daily as needed for muscle spasms. 05/05/22   Aundra Dubin, PA-C  ondansetron (ZOFRAN) 4 MG tablet Take 1 tablet (4 mg total) by mouth every 8 (eight) hours as needed for nausea or vomiting. 05/05/22   Aundra Dubin, PA-C  oxyCODONE-acetaminophen (PERCOCET) 5-325 MG tablet Take 1-2 tablets by mouth every 6 (six) hours as needed. To be taken after surgery 05/05/22   Aundra Dubin, PA-C     Positive ROS: All other systems have been reviewed and were otherwise negative with the exception of those mentioned in the HPI and as above.  Physical Exam: General: Alert, no acute distress Cardiovascular: No pedal edema Respiratory: No cyanosis, no use of accessory musculature GI: abdomen soft Skin: No lesions in the area of chief complaint Neurologic: Sensation intact distally Psychiatric: Patient is competent for consent with normal mood and affect Lymphatic: no lymphedema  MUSCULOSKELETAL: exam stable  Assessment: RIGHT KNEE DEGENERATIVE JOINT DISEASE  Plan: Plan for Procedure(s): TOTAL KNEE ARTHROPLASTY  The risks benefits and alternatives were discussed with the patient including but not limited to the risks of nonoperative treatment, versus surgical intervention including infection, bleeding, nerve injury,  blood clots, cardiopulmonary complications, morbidity, mortality, among others, and they were willing to proceed.   Eduard Roux, MD 05/13/2022 5:56 AM

## 2022-05-13 NOTE — Transfer of Care (Signed)
Immediate Anesthesia Transfer of Care Note  Patient: Heather Diaz  Procedure(s) Performed: TOTAL KNEE ARTHROPLASTY (Right: Knee)  Patient Location: PACU  Anesthesia Type:Spinal  Level of Consciousness: awake, alert , and oriented  Airway & Oxygen Therapy: Patient Spontanous Breathing  Post-op Assessment: Report given to RN, Post -op Vital signs reviewed and stable, and Patient able to stick tongue midline  Post vital signs: Reviewed  Last Vitals:  Vitals Value Taken Time  BP 171/97 05/13/22 1135  Temp 98.6   Pulse 61 05/13/22 1138  Resp 18 05/13/22 1138  SpO2 96 % 05/13/22 1138  Vitals shown include unvalidated device data.  Last Pain:  Vitals:   05/13/22 0630  TempSrc:   PainSc: 0-No pain         Complications: No notable events documented.

## 2022-05-13 NOTE — Anesthesia Postprocedure Evaluation (Signed)
Anesthesia Post Note  Patient: Heather Diaz  Procedure(s) Performed: TOTAL KNEE ARTHROPLASTY (Right: Knee)     Patient location during evaluation: PACU Anesthesia Type: Spinal Level of consciousness: awake and alert, patient cooperative and oriented Pain management: pain level controlled Vital Signs Assessment: post-procedure vital signs reviewed and stable Respiratory status: nonlabored ventilation, spontaneous breathing and respiratory function stable Cardiovascular status: blood pressure returned to baseline and stable Postop Assessment: no apparent nausea or vomiting, spinal receding and patient able to bend at knees Anesthetic complications: no   No notable events documented.  Last Vitals:  Vitals:   05/13/22 1135 05/13/22 1145  BP: (!) 171/97 (!) 165/88  Pulse: 63 (!) 59  Resp: 19 17  Temp:    SpO2: 96% 98%    Last Pain:  Vitals:   05/13/22 1224  TempSrc:   PainSc: 7                  Monnica Saltsman,E. Abdul Beirne

## 2022-05-13 NOTE — Anesthesia Procedure Notes (Signed)
Anesthesia Regional Block: Adductor canal block   Pre-Anesthetic Checklist: , timeout performed,  Correct Patient, Correct Site, Correct Laterality,  Correct Procedure, Correct Position, site marked,  Risks and benefits discussed,  Surgical consent,  Pre-op evaluation,  At surgeon's request and post-op pain management  Laterality: Right and Lower  Prep: chloraprep       Needles:  Injection technique: Single-shot  Needle Type: Echogenic Needle     Needle Length: 9cm  Needle Gauge: 21     Additional Needles:   Procedures:,,,, ultrasound used (permanent image in chart),,    Narrative:  Start time: 05/13/2022 8:12 AM End time: 05/13/2022 8:18 AM Injection made incrementally with aspirations every 5 mL.  Performed by: Personally  Anesthesiologist: Annye Asa, MD  Additional Notes: Pt identified in Holding room.  Monitors applied. Working IV access confirmed. Sterile prep R thigh.  #21ga ECHOgenic Arrow block needle into adductor canal with US guidance.  20cc 0.75% Ropivacaine injected incrementally after negative test dose.  Patient asymptomatic, VSS, no heme aspirated, tolerated well.   Jenita Seashore, MD

## 2022-05-14 ENCOUNTER — Other Ambulatory Visit: Payer: Self-pay

## 2022-05-14 ENCOUNTER — Encounter (HOSPITAL_COMMUNITY): Payer: Self-pay | Admitting: Orthopaedic Surgery

## 2022-05-14 DIAGNOSIS — I10 Essential (primary) hypertension: Secondary | ICD-10-CM | POA: Diagnosis not present

## 2022-05-14 DIAGNOSIS — Z79899 Other long term (current) drug therapy: Secondary | ICD-10-CM | POA: Diagnosis not present

## 2022-05-14 DIAGNOSIS — M1711 Unilateral primary osteoarthritis, right knee: Secondary | ICD-10-CM | POA: Diagnosis not present

## 2022-05-14 DIAGNOSIS — Z7982 Long term (current) use of aspirin: Secondary | ICD-10-CM | POA: Diagnosis not present

## 2022-05-14 LAB — CBC
HCT: 37.7 % (ref 36.0–46.0)
Hemoglobin: 10.7 g/dL — ABNORMAL LOW (ref 12.0–15.0)
MCH: 16.2 pg — ABNORMAL LOW (ref 26.0–34.0)
MCHC: 28.4 g/dL — ABNORMAL LOW (ref 30.0–36.0)
MCV: 57.2 fL — ABNORMAL LOW (ref 80.0–100.0)
Platelets: 273 10*3/uL (ref 150–400)
RBC: 6.59 MIL/uL — ABNORMAL HIGH (ref 3.87–5.11)
RDW: 19.5 % — ABNORMAL HIGH (ref 11.5–15.5)
WBC: 9.5 10*3/uL (ref 4.0–10.5)
nRBC: 0 % (ref 0.0–0.2)

## 2022-05-14 NOTE — Progress Notes (Addendum)
Subjective: 1 Day Post-Op Procedure(s) (LRB): TOTAL KNEE ARTHROPLASTY (Right) Patient reports pain as moderate.  Had some issues with muscle spasms overnight  Objective: Vital signs in last 24 hours: Temp:  [97 F (36.1 C)-98 F (36.7 C)] 98 F (36.7 C) (10/10 0500) Pulse Rate:  [58-76] 76 (10/10 0500) Resp:  [9-20] 20 (10/10 0500) BP: (151-186)/(67-99) 186/97 (10/10 0500) SpO2:  [94 %-100 %] 100 % (10/10 0500)  Intake/Output from previous day: 10/09 0701 - 10/10 0700 In: 1800 [I.V.:1600; IV Piggyback:200] Out: 2555 [Urine:2525; Blood:30] Intake/Output this shift: No intake/output data recorded.  Recent Labs    05/14/22 0141  HGB 10.7*   Recent Labs    05/14/22 0141  WBC 9.5  RBC 6.59*  HCT 37.7  PLT 273   No results for input(s): "NA", "K", "CL", "CO2", "BUN", "CREATININE", "GLUCOSE", "CALCIUM" in the last 72 hours. No results for input(s): "LABPT", "INR" in the last 72 hours.  Neurologically intact Neurovascular intact Sensation intact distally Intact pulses distally Dorsiflexion/Plantar flexion intact Incision: dressing C/D/I No cellulitis present   Assessment/Plan: 1 Day Post-Op Procedure(s) (LRB): TOTAL KNEE ARTHROPLASTY (Right) Advance diet Up with therapy D/C IV fluids Discharge home with home health after second PT session ONLY if patient has voided post foley removal, pain is under control AND is cleared by PT WBAT RLE ABLA- mild and stable   Anticipated LOS equal to or greater than 2 midnights due to - Age 63 and older with one or more of the following:  - Obesity  - Expected need for hospital services (PT, OT, Nursing) required for safe  discharge  - Anticipated need for postoperative skilled nursing care or inpatient rehab  - Active co-morbidities: None OR   - Unanticipated findings during/Post Surgery: Slow post-op progression: GI, pain control, mobility  - Patient is a high risk of re-admission due to: None   Aundra Dubin 05/14/2022, 7:51 AM

## 2022-05-14 NOTE — Evaluation (Signed)
Occupational Therapy Evaluation Patient Details Name: Heather Diaz MRN: [id number] DOB: 1961/05/27 Today's Date: 05/14/2022   History of Present Illness Pt is a 61 y/o female s/p R TKA. PMH includes HTN.   Clinical Impression   PTA, pt was living with alone (family will stay at dc) and was independent. Currently, pt requires Supervision for ADLs and functional mobility using RW. Provided education on LB ADLs, toilet transfer, and tub transfer with shower seat; pt demonstrated understanding. Answered all pt questions. Recommend dc home once medically stable per physician. All acute OT needs met and will sign off. Thank you.      Recommendations for follow up therapy are one component of a multi-disciplinary discharge planning process, led by the attending physician.  Recommendations may be updated based on patient status, additional functional criteria and insurance authorization.   Follow Up Recommendations  No OT follow up    Assistance Recommended at Discharge Frequent or constant Supervision/Assistance  Patient can return home with the following      Functional Status Assessment  Patient has had a recent decline in their functional status and demonstrates the ability to make significant improvements in function in a reasonable and predictable amount of time.  Equipment Recommendations  None recommended by OT    Recommendations for Other Services       Precautions / Restrictions Precautions Precautions: Knee Precaution Booklet Issued: No Precaution Comments: Verbally reviewed knee precautions Restrictions Weight Bearing Restrictions: Yes RLE Weight Bearing: Weight bearing as tolerated      Mobility Bed Mobility               General bed mobility comments: In recliner upon arrival    Transfers Overall transfer level: Needs assistance Equipment used: Rolling walker (2 wheels) Transfers: Sit to/from Stand Sit to Stand: Supervision           General  transfer comment: Supervision for safety      Balance Overall balance assessment: Needs assistance Sitting-balance support: Feet supported, No upper extremity supported Sitting balance-Leahy Scale: Good     Standing balance support: During functional activity Standing balance-Leahy Scale: Fair                             ADL either performed or assessed with clinical judgement   ADL Overall ADL's : Needs assistance/impaired                                       General ADL Comments: Educating pt on safe LB dressing techniques, toilet trasnfer, and tub trasnfer with shower seat. Pt performing with increased time and at Supervision elvel     Vision         Perception     Praxis      Pertinent Vitals/Pain Pain Assessment Pain Assessment: 0-10 Pain Score: 5  Pain Location: R knee Pain Descriptors / Indicators: Guarding, Sore, Operative site guarding Pain Intervention(s): Monitored during session, Limited activity within patient's tolerance, Repositioned     Hand Dominance     Extremity/Trunk Assessment Upper Extremity Assessment Upper Extremity Assessment: Overall WFL for tasks assessed   Lower Extremity Assessment Lower Extremity Assessment: Defer to PT evaluation RLE Deficits / Details: Deficits consistent with post op pain and weakness.   Cervical / Trunk Assessment Cervical / Trunk Assessment: Normal   Communication Communication Communication: No difficulties  Cognition Arousal/Alertness: Awake/alert Behavior During Therapy: WFL for tasks assessed/performed Overall Cognitive Status: Within Functional Limits for tasks assessed                                       General Comments       Exercises     Shoulder Instructions      Home Living Family/patient expects to be discharged to:: Private residence Living Arrangements: Alone Available Help at Discharge: Family Type of Home: House Home Access:  Stairs to enter Technical brewer of Steps: 4-5 Entrance Stairs-Rails: Right Home Layout: One level     Bathroom Shower/Tub: Teacher, early years/pre: Standard     Home Equipment: Public relations account executive (2 wheels);Crutches;Shower seat;Toilet riser          Prior Functioning/Environment Prior Level of Function : Independent/Modified Independent             Mobility Comments: Used crutch if she was having a bad day          OT Problem List: Decreased strength;Decreased range of motion;Decreased activity tolerance;Impaired balance (sitting and/or standing);Decreased knowledge of use of DME or AE;Decreased knowledge of precautions;Pain      OT Treatment/Interventions:      OT Goals(Current goals can be found in the care plan section) Acute Rehab OT Goals Patient Stated Goal: Go home OT Goal Formulation: All assessment and education complete, DC therapy  OT Frequency:      Co-evaluation              AM-PAC OT "6 Clicks" Daily Activity     Outcome Measure Help from another person eating meals?: None Help from another person taking care of personal grooming?: None Help from another person toileting, which includes using toliet, bedpan, or urinal?: A Little Help from another person bathing (including washing, rinsing, drying)?: A Little Help from another person to put on and taking off regular upper body clothing?: None Help from another person to put on and taking off regular lower body clothing?: A Little 6 Click Score: 21   End of Session Equipment Utilized During Treatment: Rolling walker (2 wheels) Nurse Communication: Mobility status  Activity Tolerance: Patient tolerated treatment well Patient left: in chair;with call bell/phone within reach  OT Visit Diagnosis: Unsteadiness on feet (R26.81);Other abnormalities of gait and mobility (R26.89);Muscle weakness (generalized) (M62.81);Pain Pain - Right/Left: Right Pain - part of body: Knee                 Time: 7471-5953 OT Time Calculation (min): 17 min Charges:  OT General Charges $OT Visit: 1 Visit OT Evaluation $OT Eval Low Complexity: 1 Low  Itzayana Pardy MSOT, OTR/L Acute Rehab Office: [place] 05/14/2022, 11:47 AM

## 2022-05-14 NOTE — TOC Transition Note (Signed)
Transition of Care [facility]) - CM/SW Discharge Note   Patient Details  Name: Heather Diaz MRN: [id number] Date of Birth: 08-22-1960  Transition of Care [facility]) CM/SW Contact:  Sharin Mons, RN Phone Number: 05/14/2022, 11:25 AM   Clinical Narrative:    Patient will DC to: home  Anticipated DC date: 05/14/2022 Family notified: yes Transport by: car      - Status post total right knee replacement, 10/9 Per MD patient ready for DC today pending clearance from therapy. Pt states 26 y/o grandson and sister to assist with care once d/c. RN, patient,  and patient's family notified of DC. Order noted  for home health services and DME needs. Pt agreeable to home health services. Pt without provider preference. Referral made with [facility] and accepted. Pt without DME needs.  Post hospital f/u noted on AVS.. Pt without Rx MED concerns. Grandson to provide transportation to home.  RNCM will sign off for now as intervention is no longer needed. Please consult [place] again if new needs arise.    Final next level of care: [place] Barriers to Discharge: No Barriers Identified   Patient Goals and CMS Choice     Choice offered to / list presented to : Patient  Discharge Placement                       Discharge Plan and Services                          HH Arranged: OT, PT [facility] Agency: [place] Date [place]: 05/14/22 Time HH Agency Contacted: 1120 Representative spoke with at [place]: [place] Determinants of Health ([place]) Interventions     Readmission Risk Interventions     No data to display

## 2022-05-14 NOTE — Progress Notes (Signed)
Physical Therapy Treatment Patient Details Name: Heather Diaz MRN: [id number] DOB: [birth year] Today's Date: 05/14/2022   History of Present Illness Pt is a 61 y/o female s/p R TKA. PMH includes HTN.    PT Comments    Patient reports feeling sore this PM but agreeable to therapy. Session focused on stair training and instructed pt in HEP handout. Requires Mod A to ascend stairs and min guard assist to descend steps. Tolerated there ex in supine and seated positions. Discussed importance of position changes and staying ahead of the pain medication to allow for functional mobility/activity. Pt will have support of family at home. Safe to d/c from a mobility standpoint. Will follow if still in the hospital.    Recommendations for follow up therapy are one component of a multi-disciplinary discharge planning process, led by the attending physician.  Recommendations may be updated based on patient status, additional functional criteria and insurance authorization.  Follow Up Recommendations  Follow physician's recommendations for discharge plan and follow up therapies     Assistance Recommended at Discharge Intermittent Supervision/Assistance  Patient can return home with the following Assistance with cooking/housework;Assist for transportation;Help with stairs or ramp for entrance;A little help with walking and/or transfers   Equipment Recommendations  None recommended by PT    Recommendations for Other Services       Precautions / Restrictions Precautions Precautions: Knee Precaution Booklet Issued: No Precaution Comments: Verbally reviewed knee precautions Restrictions Weight Bearing Restrictions: Yes RLE Weight Bearing: Weight bearing as tolerated     Mobility  Bed Mobility Overal bed mobility: Needs Assistance Bed Mobility: Supine to Sit     Supine to sit: Supervision, HOB elevated     General bed mobility comments: Increased time to get to EOB, use of rail, no assist  needed.    Transfers Overall transfer level: Needs assistance Equipment used: Rolling walker (2 wheels) Transfers: Sit to/from Stand Sit to Stand: Min guard, From elevated surface           General transfer comment: Min guard for safety. Stood from [employer], from chair x1. Uncontrolled descent into chair.    Ambulation/Gait Ambulation/Gait assistance: Min guard Gait Distance (Feet): 5 Feet Assistive device: Rolling walker (2 wheels) Gait Pattern/deviations: Step-to pattern, Decreased stance time - right, Decreased step length - left Gait velocity: decreased     General Gait Details: Slow, antalgic gait with decreased stance time RLE.   Stairs Stairs: Yes Stairs assistance: Mod assist Stair Management: One rail Right Number of Stairs: 2 General stair comments: HHA on LUE and rail on RUE, cues for technique, anxious. mod A to ascend steps and Min guard to descend.   Wheelchair Mobility    Modified Rankin (Stroke Patients Only)       Balance Overall balance assessment: Needs assistance Sitting-balance support: Feet supported, No upper extremity supported Sitting balance-Leahy Scale: Good     Standing balance support: During functional activity Standing balance-Leahy Scale: Fair Standing balance comment: Able to stand statically without UE support but needs it for walking                            Cognition Arousal/Alertness: Awake/alert Behavior During Therapy: WFL for tasks assessed/performed Overall Cognitive Status: Within Functional Limits for tasks assessed  Exercises Total Joint Exercises Ankle Circles/Pumps: AROM, Both, 10 reps, Supine Quad Sets: AROM, Both, 10 reps, Supine Gluteal Sets: AROM, Both, 5 reps, Seated Towel Squeeze: AROM, Both, 10 reps, Seated Heel Slides: AROM, Right, AAROM, 5 reps, Supine Hip ABduction/ADduction: Right, AAROM, 5 reps, Supine Straight Leg Raises:  AAROM, Right, 5 reps, Supine Long Arc Quad: AROM, Right, 5 reps, Seated Goniometric ROM: ~10-70 degrees knee AROM    General Comments        Pertinent Vitals/Pain Pain Assessment Pain Assessment: Faces Pain Score: 5  Faces Pain Scale: Hurts even more Pain Location: R knee Pain Descriptors / Indicators: Guarding, Sore, Operative site guarding Pain Intervention(s): Monitored during session, Repositioned, Limited activity within patient's tolerance    Home Living Family/patient expects to be discharged to:: Private residence Living Arrangements: Alone Available Help at Discharge: Family Type of Home: House Home Access: Stairs to enter Entrance Stairs-Rails: Right Entrance Stairs-Number of Steps: 4-5   Home Layout: One level Home Equipment: Public relations account executive (2 wheels);Crutches;Shower seat;Toilet riser      Prior Function            PT Goals (current goals can now be found in the care plan section) Progress towards PT goals: Progressing toward goals    Frequency    7X/week      PT Plan Current plan remains appropriate    Co-evaluation              AM-PAC PT "6 Clicks" Mobility   Outcome Measure  Help needed turning from your back to your side while in a flat bed without using bedrails?: A Little Help needed moving from lying on your back to sitting on the side of a flat bed without using bedrails?: A Little Help needed moving to and from a bed to a chair (including a wheelchair)?: A Little Help needed standing up from a chair using your arms (e.g., wheelchair or bedside chair)?: A Little Help needed to walk in hospital room?: A Little Help needed climbing 3-5 steps with a railing? : A Lot 6 Click Score: 17    End of Session Equipment Utilized During Treatment: Gait belt Activity Tolerance: Patient tolerated treatment well Patient left: in chair;with call bell/phone within reach Nurse Communication: Mobility status PT Visit Diagnosis: Other  abnormalities of gait and mobility (R26.89);Pain Pain - Right/Left: Right Pain - part of body: Knee     Time: 1117-1140 PT Time Calculation (min) (ACUTE ONLY): 23 min  Charges:  $Gait Training: 8-22 mins $Therapeutic Exercise: 8-22 mins                    Marisa Severin, PT, DPT Acute Rehabilitation Services Secure chat preferred Office [place] 05/14/2022, 12:48 PM

## 2022-05-14 NOTE — Plan of Care (Signed)
  Problem: Pain Management: Goal: Pain level will decrease with appropriate interventions Outcome: Progressing   Problem: Education: Goal: Knowledge of General Education information will improve Description: Including pain rating scale, medication(s)/side effects and non-pharmacologic comfort measures Outcome: Progressing   Problem: Health Behavior/Discharge Planning: Goal: Ability to manage health-related needs will improve Outcome: Progressing   Problem: Activity: Goal: Risk for activity intolerance will decrease Outcome: Progressing   Problem: Nutrition: Goal: Adequate nutrition will be maintained Outcome: Progressing   Problem: Coping: Goal: Level of anxiety will decrease Outcome: Progressing

## 2022-05-14 NOTE — Discharge Summary (Signed)
Patient ID: Heather Diaz MRN: [id number] DOB/AGE: 09-27-1960 61 y.o.  Admit date: 05/13/2022 Discharge date: 05/14/2022  Admission Diagnoses:  Principal Problem:   Primary osteoarthritis of right knee Active Problems:   Status post total right knee replacement   Discharge Diagnoses:  Same  Past Medical History:  Diagnosis Date   Anemia    thalacemia   Anxiety    Arthritis    Hypertension     Surgeries: Procedure(s): TOTAL KNEE ARTHROPLASTY on 05/13/2022   Consultants:   Discharged Condition: Improved  Hospital Course: Heather Diaz is an 61 y.o. female who was admitted 05/13/2022 for operative treatment ofPrimary osteoarthritis of right knee. Patient has severe unremitting pain that affects sleep, daily activities, and work/hobbies. After pre-op clearance the patient was taken to the operating room on 05/13/2022 and underwent  Procedure(s): TOTAL KNEE ARTHROPLASTY.    Patient was given perioperative antibiotics:  Anti-infectives (From admission, onward)    Start     Dose/Rate Route Frequency Ordered Stop   05/13/22 1800  ceFAZolin (ANCEF) IVPB 2g/100 mL premix        2 g 200 mL/hr over 30 Minutes Intravenous Every 6 hours 05/13/22 1432 05/14/22 0505   05/13/22 0953  vancomycin (VANCOCIN) powder  Status:  Discontinued          As needed 05/13/22 0953 05/13/22 1131   05/13/22 0615  ceFAZolin (ANCEF) IVPB 2g/100 mL premix        2 g 200 mL/hr over 30 Minutes Intravenous On call to O.R. 05/13/22 6734 05/13/22 0914        Patient was given sequential compression devices, early ambulation, and chemoprophylaxis to prevent DVT.  Patient benefited maximally from hospital stay and there were no complications.    Recent vital signs: Patient Vitals for the past 24 hrs:  BP Temp Temp src Pulse Resp SpO2  05/14/22 0500 (!) 186/97 98 F (36.7 C) Oral 76 20 100 %  05/13/22 2100 (!) 152/67 97.8 F (36.6 C) Oral 72 16 100 %  05/13/22 1553 (!) 166/97 98 F (36.7 C) Oral 73 18  100 %  05/13/22 1445 (!) 167/88 -- -- 72 19 98 %  05/13/22 1430 (!) 159/77 -- -- 63 14 98 %  05/13/22 1300 (!) 158/91 (!) 97 F (36.1 C) -- (!) 58 14 100 %  05/13/22 1245 (!) 151/86 -- -- (!) 59 12 98 %  05/13/22 1224 (!) 161/69 -- -- 60 (!) 9 100 %  05/13/22 1145 (!) 165/88 -- -- (!) 59 17 98 %  05/13/22 1135 (!) 171/97 -- -- 63 19 96 %  05/13/22 0830 (!) 156/99 -- -- 68 18 98 %  05/13/22 0829 -- -- -- 67 18 96 %  05/13/22 0828 -- -- -- 67 19 96 %  05/13/22 0827 -- -- -- 64 16 97 %  05/13/22 0826 -- -- -- 70 14 99 %  05/13/22 0825 (!) 153/92 -- -- 66 14 98 %  05/13/22 0824 -- -- -- 68 16 99 %  05/13/22 0823 -- -- -- 67 13 94 %  05/13/22 0822 -- -- -- 61 15 97 %  05/13/22 0821 -- -- -- 71 14 98 %  05/13/22 0820 (!) 164/96 -- -- 73 15 96 %  05/13/22 0819 -- -- -- 64 16 96 %  05/13/22 0818 -- -- -- 67 16 98 %  05/13/22 0817 -- -- -- 68 16 99 %  05/13/22 0816 -- -- -- 69 18 100 %  05/13/22 0815 (!) 170/96 -- -- -- -- --     Recent laboratory studies:  Recent Labs    05/14/22 0141  WBC 9.5  HGB 10.7*  HCT 37.7  PLT 273     Discharge Medications:   Allergies as of 05/14/2022       Reactions   Reglan [metoclopramide] Other (See Comments)   confusion        Medication List     STOP taking these medications    celecoxib 200 MG capsule Commonly known as: CELEBREX   cyclobenzaprine 5 MG tablet Commonly known as: FLEXERIL   diclofenac 75 MG EC tablet Commonly known as: VOLTAREN   Voltaren 1 % Gel Generic drug: diclofenac Sodium       TAKE these medications    Adult Gummy Chew Chew 1 each by mouth daily.   ALPRAZolam 0.5 MG tablet Commonly known as: Xanax Take 1 tablet (0.5 mg total) by mouth daily as needed for anxiety   aspirin EC 81 MG tablet Take 1 tablet (81 mg total) by mouth 2 (two) times daily. To be taken after surgery to prevent blood clots   cyanocobalamin 2000 MCG tablet Take 2,000 mcg by mouth daily.   docusate sodium 100 MG  capsule Commonly known as: Colace Take 1 capsule (100 mg total) by mouth daily as needed.   estradiol 0.05 MG/24HR patch Commonly known as: VIVELLE-DOT Apply 1 patch onto skin twice a week   methocarbamol 750 MG tablet Commonly known as: Robaxin-750 Take 1 tablet (750 mg total) by mouth 2 (two) times daily as needed for muscle spasms.   metoprolol 200 MG 24 hr tablet Commonly known as: Toprol XL Take 1 tablet (200 mg total) by mouth daily.   ondansetron 4 MG tablet Commonly known as: Zofran Take 1 tablet (4 mg total) by mouth every 8 (eight) hours as needed for nausea or vomiting.   oxyCODONE-acetaminophen 5-325 MG tablet Commonly known as: Percocet Take 1-2 tablets by mouth every 6 (six) hours as needed. To be taken after surgery   triamterene-hydrochlorothiazide 75-50 MG tablet Commonly known as: Maxzide Take 1 tablet by mouth every morning.   zolpidem 10 MG tablet Commonly known as: AMBIEN Take 1/2-1 tablet (5-10 mg total) by mouth at bedtime as needed. What changed:  how much to take when to take this               Durable Medical Equipment  (From admission, onward)           Start     Ordered   05/13/22 1433  DME Walker rolling  Once       Question Answer Comment  Walker: With 5 Inch Wheels   Patient needs a walker to treat with the following condition Status post left partial knee replacement      05/13/22 1432   05/13/22 1433  DME 3 n 1  Once        05/13/22 1432   05/13/22 1433  DME Bedside commode  Once       Question:  Patient needs a bedside commode to treat with the following condition  Answer:  Status post left partial knee replacement   05/13/22 1432            Diagnostic Studies: DG Knee Right Port  Result Date: 05/13/2022 CLINICAL DATA:  Status post total knee arthroplasty. EXAM: PORTABLE RIGHT KNEE - 1-2 VIEW COMPARISON:  Right knee radiographs 04/05/2022 FINDINGS: Status post right knee arthroplasty. No  perihardware lucency is  seen to indicate hardware failure or loosening. Mild-to-moderate joint effusion with minimal intra-articular air. Mild predominantly anterior and lateral knee soft tissue swelling and subcutaneous air. No acute fracture or dislocation. IMPRESSION: Status post right knee arthroplasty without evidence of hardware failure. Electronically Signed   By: Yvonne Kendall M.D.   On: 05/13/2022 11:59    Disposition: Discharge disposition: 01-Home or Self Care          Follow-up Information     Leandrew Koyanagi, MD. Schedule an appointment as soon as possible for a visit in 2 week(s).   Specialty: Orthopedic Surgery Contact information: [address] [place] [address] [phone]                  Signed: Aundra Dubin 05/14/2022, 7:53 AM

## 2022-05-14 NOTE — Progress Notes (Addendum)
Physical Therapy Treatment Patient Details Name: Heather Diaz MRN: [id number] DOB: 06-Jun-1961 Today's Date: 05/14/2022   History of Present Illness Pt is Heather 61 y/o female s/p R TKA. PMH includes HTN.    PT Comments    Patient progressing well towards PT goals. Session focused on gait training, transfers and there ex. Requires Min guard-Min Heather for standing and ambulation with use of RW. Instructed pt in exercises to perform daily and knee positioning. Knee AROM ~10-75 degrees. Plan for stair training and HEP handout for second session to prepare for d/c home. Will follow.    Recommendations for follow up therapy are one component of Heather multi-disciplinary discharge planning process, led by the attending physician.  Recommendations may be updated based on patient status, additional functional criteria and insurance authorization.  Follow Up Recommendations  Follow physician's recommendations for discharge plan and follow up therapies     Assistance Recommended at Discharge Intermittent Supervision/Assistance  Patient can return home with the following Assistance with cooking/housework;Assist for transportation;Help with stairs or ramp for entrance;Heather little help with walking and/or transfers   Equipment Recommendations  None recommended by PT    Recommendations for Other Services       Precautions / Restrictions Precautions Precautions: Knee Precaution Booklet Issued: No Precaution Comments: Verbally reviewed knee precautions Restrictions Weight Bearing Restrictions: Yes RLE Weight Bearing: Weight bearing as tolerated     Mobility  Bed Mobility Overal bed mobility: Needs Assistance Bed Mobility: Supine to Sit     Supine to sit: Supervision, HOB elevated     General bed mobility comments: Supervision for safety. Stood from [employer].    Transfers Overall transfer level: Needs assistance Equipment used: Rolling walker (2 wheels) Transfers: Sit to/from Stand Sit to Stand:  Min assist, From elevated surface           General transfer comment: Min Heather to power to standing with cues for hand placement/technique. Stood from [employer], transferred to chair post ambulation.    Ambulation/Gait Ambulation/Gait assistance: Min assist, Min guard Gait Distance (Feet): 100 Feet Assistive device: Rolling walker (2 wheels) Gait Pattern/deviations: Step-to pattern, Decreased stance time - right, Decreased step length - left Gait velocity: decreased     General Gait Details: Slow, antalgic gait with decreased stance time RLE, cues for knee extension during stance phase. Heather few LOB posteriorly and cues for RW proximity/management. 2 standing rest breaks.   Stairs             Wheelchair Mobility    Modified Rankin (Stroke Patients Only)       Balance Overall balance assessment: Needs assistance Sitting-balance support: Feet supported, No upper extremity supported Sitting balance-Leahy Scale: Good     Standing balance support: During functional activity Standing balance-Leahy Scale: Fair Standing balance comment: Able to stand statically without UE support but needs it for walking                            Cognition Arousal/Alertness: Awake/alert Behavior During Therapy: WFL for tasks assessed/performed Overall Cognitive Status: Within Functional Limits for tasks assessed                                          Exercises Total Joint Exercises Ankle Circles/Pumps: AROM, Both, 10 reps, Supine Quad Sets: AROM, Both, 10 reps, Supine Gluteal Sets: AROM,  Both, 5 reps, Seated Heel Slides: AROM, Right, 5 reps, Seated (20 sec hold) Goniometric ROM: ~10-70 degrees knee AROM    General Comments        Pertinent Vitals/Pain Pain Assessment Pain Assessment: 0-10 Pain Score: 5  Pain Location: R knee Pain Descriptors / Indicators: Guarding, Sore, Operative site guarding Pain Intervention(s): Monitored during session,  Repositioned, Limited activity within patient's tolerance, RN gave pain meds during session    Home Living                          Prior Function            PT Goals (current goals can now be found in the care plan section) Progress towards PT goals: Progressing toward goals    Frequency    7X/week      PT Plan Current plan remains appropriate    Co-evaluation              AM-PAC PT "6 Clicks" Mobility   Outcome Measure  Help needed turning from your back to your side while in Heather flat bed without using bedrails?: Heather Little Help needed moving from lying on your back to sitting on the side of Heather flat bed without using bedrails?: Heather Little Help needed moving to and from Heather bed to Heather chair (including Heather wheelchair)?: Heather Little Help needed standing up from Heather chair using your arms (e.g., wheelchair or bedside chair)?: Heather Little Help needed to walk in hospital room?: Heather Little Help needed climbing 3-5 steps with Heather railing? : Heather Lot 6 Click Score: 17    End of Session Equipment Utilized During Treatment: Gait belt Activity Tolerance: Patient tolerated treatment well Patient left: in chair;with call bell/phone within reach Nurse Communication: Mobility status PT Visit Diagnosis: Other abnormalities of gait and mobility (R26.89);Pain Pain - Right/Left: Right Pain - part of body: Knee     Time: 0829-0900 PT Time Calculation (min) (ACUTE ONLY): 31 min  Charges:  $Gait Training: 8-22 mins $Therapeutic Activity: 8-22 mins                     Heather Diaz, PT, DPT Acute Rehabilitation Services Secure chat preferred Office [phone]      Heather Diaz Heather Diaz 05/14/2022, 9:13 AM

## 2022-05-15 DIAGNOSIS — M1711 Unilateral primary osteoarthritis, right knee: Secondary | ICD-10-CM | POA: Diagnosis not present

## 2022-05-15 DIAGNOSIS — Z96651 Presence of right artificial knee joint: Secondary | ICD-10-CM | POA: Diagnosis not present

## 2022-05-16 DIAGNOSIS — I10 Essential (primary) hypertension: Secondary | ICD-10-CM | POA: Diagnosis not present

## 2022-05-16 DIAGNOSIS — F419 Anxiety disorder, unspecified: Secondary | ICD-10-CM | POA: Diagnosis not present

## 2022-05-16 DIAGNOSIS — Z96651 Presence of right artificial knee joint: Secondary | ICD-10-CM | POA: Diagnosis not present

## 2022-05-16 DIAGNOSIS — D649 Anemia, unspecified: Secondary | ICD-10-CM | POA: Diagnosis not present

## 2022-05-16 DIAGNOSIS — Z7982 Long term (current) use of aspirin: Secondary | ICD-10-CM | POA: Diagnosis not present

## 2022-05-16 DIAGNOSIS — M1990 Unspecified osteoarthritis, unspecified site: Secondary | ICD-10-CM | POA: Diagnosis not present

## 2022-05-16 DIAGNOSIS — Z9181 History of falling: Secondary | ICD-10-CM | POA: Diagnosis not present

## 2022-05-16 DIAGNOSIS — Z471 Aftercare following joint replacement surgery: Secondary | ICD-10-CM | POA: Diagnosis not present

## 2022-05-20 DIAGNOSIS — I10 Essential (primary) hypertension: Secondary | ICD-10-CM | POA: Diagnosis not present

## 2022-05-20 DIAGNOSIS — Z471 Aftercare following joint replacement surgery: Secondary | ICD-10-CM | POA: Diagnosis not present

## 2022-05-20 DIAGNOSIS — Z96651 Presence of right artificial knee joint: Secondary | ICD-10-CM | POA: Diagnosis not present

## 2022-05-20 DIAGNOSIS — D649 Anemia, unspecified: Secondary | ICD-10-CM | POA: Diagnosis not present

## 2022-05-20 DIAGNOSIS — Z7982 Long term (current) use of aspirin: Secondary | ICD-10-CM | POA: Diagnosis not present

## 2022-05-20 DIAGNOSIS — Z9181 History of falling: Secondary | ICD-10-CM | POA: Diagnosis not present

## 2022-05-20 DIAGNOSIS — M1990 Unspecified osteoarthritis, unspecified site: Secondary | ICD-10-CM | POA: Diagnosis not present

## 2022-05-20 DIAGNOSIS — F419 Anxiety disorder, unspecified: Secondary | ICD-10-CM | POA: Diagnosis not present

## 2022-05-20 NOTE — Telephone Encounter (Signed)
[facility] called stating she is refusing home health OT [place]

## 2022-05-21 ENCOUNTER — Other Ambulatory Visit: Payer: Self-pay | Admitting: Physician Assistant

## 2022-05-21 ENCOUNTER — Encounter: Payer: Self-pay | Admitting: Orthopaedic Surgery

## 2022-05-21 ENCOUNTER — Other Ambulatory Visit (HOSPITAL_COMMUNITY): Payer: Self-pay

## 2022-05-21 ENCOUNTER — Telehealth: Payer: Self-pay

## 2022-05-21 MED ORDER — OXYCODONE-ACETAMINOPHEN 5-325 MG PO TABS
1.0000 | ORAL_TABLET | Freq: Three times a day (TID) | ORAL | 0 refills | Status: DC | PRN
  Filled 2022-05-21: qty 40, 7d supply, fill #0

## 2022-05-21 NOTE — Telephone Encounter (Signed)
Patient called and left voicemail on my phone. She had a total knee arthroplasty on 05/13/2022. She states that her bandage was causing her leg to itch. She took it off in her sleep after a week of having it on. She is keeping it covered with a dry sterile bandage. She has ran out of pain meds and wants to know what she should take because she is still in pain. She also wants to know if she can resume taking her meds that she was taking preoperatively.  Please advise and I'll call her back. [phone]

## 2022-05-21 NOTE — Telephone Encounter (Signed)
Refilled percocet.  Ok to resume meds on her current list

## 2022-05-21 NOTE — Telephone Encounter (Signed)
Called and spoke with patient. Relayed information to patient.

## 2022-05-22 ENCOUNTER — Other Ambulatory Visit (HOSPITAL_COMMUNITY): Payer: Self-pay

## 2022-05-22 DIAGNOSIS — Z7982 Long term (current) use of aspirin: Secondary | ICD-10-CM | POA: Diagnosis not present

## 2022-05-22 DIAGNOSIS — Z471 Aftercare following joint replacement surgery: Secondary | ICD-10-CM | POA: Diagnosis not present

## 2022-05-22 DIAGNOSIS — I10 Essential (primary) hypertension: Secondary | ICD-10-CM | POA: Diagnosis not present

## 2022-05-22 DIAGNOSIS — D649 Anemia, unspecified: Secondary | ICD-10-CM | POA: Diagnosis not present

## 2022-05-22 DIAGNOSIS — F419 Anxiety disorder, unspecified: Secondary | ICD-10-CM | POA: Diagnosis not present

## 2022-05-22 DIAGNOSIS — Z9181 History of falling: Secondary | ICD-10-CM | POA: Diagnosis not present

## 2022-05-22 DIAGNOSIS — M1990 Unspecified osteoarthritis, unspecified site: Secondary | ICD-10-CM | POA: Diagnosis not present

## 2022-05-22 DIAGNOSIS — Z96651 Presence of right artificial knee joint: Secondary | ICD-10-CM | POA: Diagnosis not present

## 2022-05-24 ENCOUNTER — Telehealth: Payer: Self-pay | Admitting: Orthopaedic Surgery

## 2022-05-24 NOTE — Telephone Encounter (Signed)
Heather Diaz (PT) from [facility] called with an update for pt. [name] states pt did not fell up to doing home health therapy today. Frequency this wk was not completed. Heather Diaz phone number is [phone].

## 2022-05-28 ENCOUNTER — Ambulatory Visit (INDEPENDENT_AMBULATORY_CARE_PROVIDER_SITE_OTHER): Payer: 59 | Admitting: Orthopaedic Surgery

## 2022-05-28 ENCOUNTER — Other Ambulatory Visit (HOSPITAL_COMMUNITY): Payer: Self-pay

## 2022-05-28 ENCOUNTER — Encounter: Payer: 59 | Admitting: Orthopaedic Surgery

## 2022-05-28 DIAGNOSIS — Z96651 Presence of right artificial knee joint: Secondary | ICD-10-CM

## 2022-05-28 MED ORDER — CEFADROXIL 500 MG PO CAPS
500.0000 mg | ORAL_CAPSULE | Freq: Two times a day (BID) | ORAL | 0 refills | Status: AC
  Filled 2022-05-28: qty 20, 10d supply, fill #0

## 2022-05-28 NOTE — Therapy (Signed)
OUTPATIENT PHYSICAL THERAPY EVALUATION   Patient Name: Heather Diaz MRN: [id number] DOB:05/06/1961, 61 y.o., female Today's Date: 05/30/2022  END OF SESSION:   PT End of Session - 05/30/22 0856     Visit Number 1    Number of Visits 20    Date for PT Re-Evaluation 08/08/22    Authorization Type [facility] $25 copay    Progress Note Due on Visit 10    PT Start Time 0857    PT Stop Time 0930    PT Time Calculation (min) 33 min    Activity Tolerance Patient tolerated treatment well    Behavior During Therapy [facility] for tasks assessed/performed             Past Medical History:  Diagnosis Date   Anemia    thalacemia   Anxiety    Arthritis    Hypertension    Past Surgical History:  Procedure Laterality Date   ABDOMINAL HYSTERECTOMY     KNEE ARTHROSCOPY WITH MEDIAL MENISECTOMY Left 12/22/2020   Procedure: LEFT KNEE ARTHROSCOPY WITH PARTIAL MEDIAL MENISECTOMY;  Surgeon: Tarry Kos, MD;  [facility];  Service: Orthopedics;  Laterality: Left;   KNEE ARTHROSCOPY WITH MEDIAL MENISECTOMY Right 10/24/2021   Procedure: right knee arthroscopy partial medial and partial lateral meniscectomy and synovectomy;  Surgeon: Tarry Kos, MD;  Location: [facility];  Service: Orthopedics;  Laterality: Right;   REDUCTION MAMMAPLASTY Bilateral    TOTAL KNEE ARTHROPLASTY Right 05/13/2022   Procedure: TOTAL KNEE ARTHROPLASTY;  Surgeon: Tarry Kos, MD;  Location: [facility];  Service: Orthopedics;  Laterality: Right;   Patient Active Problem List   Diagnosis Date Noted   Primary osteoarthritis of right knee 05/13/2022   Status post total right knee replacement 05/13/2022   Acute medial meniscus tear of left knee 12/22/2020   Acute tear lateral meniscus, left, initial encounter 12/22/2020    PCP: Laurann Montana MD  REFERRING PROVIDER: Tarry Kos, MD  REFERRING DIAG: [phone] (ICD-10-CM) - Status post total right knee replacement  THERAPY DIAG:   Chronic pain of right knee  Muscle weakness (generalized)  Difficulty in walking, not elsewhere classified  Localized edema  Rationale for Evaluation and Treatment Rehabilitation  ONSET DATE: 05/13/2022 surgery TKA  SUBJECTIVE:   SUBJECTIVE STATEMENT: Pt came to clinic s/p recent Rt TKA on 05/13/2022.  Pt indicated having home health.  Pt indicated having very painful time since surgery.  Pt indicated having stitches removed and that led to increased pain.  Pt indicated walking without a device at this time.  Pt indicated taking medicine due to pain at this time.  Reported still having CPM machine and working on some exercise from HEP.   Pt indicated walking without device for about a week.   Pt indicated sleep difficulty due to symptoms. Reported sleeping in bed and couch as well.   PERTINENT HISTORY: History of Lt knee surgery  PAIN:  NPRS scale: at current 4.5- 5/10, at worst in last week 7/10 Pain location: Rt knee Pain description: stiffness, constant Aggravating factors: sleeping positioning, quick turns, end ranges, prolonged WB activity Relieving factors: pain medicine, icing  PRECAUTIONS: None  WEIGHT BEARING RESTRICTIONS: No  FALLS:  Has patient fallen in last 6 months? No  LIVING ENVIRONMENT: Lives in: House/apartment Stairs: 5 stairs to enter , rail on Rt side.    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  Walking for exercise.   PLOF: Independent, housework,  have dog.   PATIENT GOALS: Reduce pain, walk and normal daily living without pain.    OBJECTIVE:    PATIENT SURVEYS:  05/30/2022 FOTO intake:  62  predicted:  66  COGNITION: 05/30/2022 Overall cognitive status: WFL    SENSATION: 05/30/2022 WFL  EDEMA:  05/30/2022 Localized edema observed Rt knee/leg  MUSCLE LENGTH: 05/30/2022 No specific testing  POSTURE:  05/30/2022 Weight shift to Lt in standing.   PALPATION: 05/30/2022 Mild tenderness joint lines,  peri  incision  LOWER EXTREMITY ROM:   ROM Right 05/30/2022 Left 05/30/2022  Hip flexion    Hip extension    Hip abduction    Hip adduction    Hip internal rotation    Hip external rotation    Knee flexion 105 AROM in supine heel slide   Knee extension -19 AROM in seated LAQ, heel prop PROM -10    Ankle dorsiflexion    Ankle plantarflexion    Ankle inversion    Ankle eversion     (Blank rows = not tested)  LOWER EXTREMITY MMT:  MMT Right 05/30/2022 Left 05/30/2022  Hip flexion 5/5 5/5  Hip extension    Hip abduction    Hip adduction    Hip internal rotation    Hip external rotation    Knee flexion 4/5 5/5  Knee extension 2/5 5/5  Ankle dorsiflexion 5/5 5/5  Ankle plantarflexion    Ankle inversion    Ankle eversion     (Blank rows = not tested)  LOWER EXTREMITY SPECIAL TESTS:  05/30/2022 No specific testing  FUNCTIONAL TESTS:  05/30/2022 18 inch chair transfer: able to perform s UE assist but noted deviation to WB on Lt leg Lt SLS: 15 seconds Rt SLS: < 3 seconds  GAIT: 05/30/2022 Independent ambulation c antalgic gait, maintained knee flexion in stance on Rt, reduced stance on Rt, reduced step length on Lt.    TODAY'S TREATMENT                                                                          DATE: 05/30/2022 Therex:    HEP instruction/performance c cues for techniques, handout provided.  Trial set performed of each for comprehension and symptom assessment.  See below for exercise list  PATIENT EDUCATION:  05/30/2022 Education details: HEP, POC Person educated: Patient Education method: Explanation, Demonstration, Verbal cues, and Handouts Education comprehension: verbalized understanding, returned demonstration, and verbal cues required  HOME EXERCISE PROGRAM: Access Code: [email or web address] URL: [email or web address] Date: 05/30/2022 Prepared by: Scot Jun  Exercises - Seated Quad Set  - 3-5 x daily - 7 x weekly - 1 sets - 10  reps - 5 hold - Supine Heel Slide with Strap  - 3-5 x daily - 7 x weekly - 1 sets - 10 reps - 5 hold - Seated Long Arc Quad  - 3-5 x daily - 7 x weekly - 1-2 sets - 10 reps - 2 hold - Supine Knee Extension Mobilization with Weight  - 3-5 x daily - 7 x weekly - 1 sets - 1 reps - to tolerance up to 15 mins hold - Seated Passive Knee Extension (Mirrored)  - 3-5 x daily - 7 x weekly - 1 sets - 5  reps - to tolerance hold  ASSESSMENT:  CLINICAL IMPRESSION: Patient is a 61 y.o. who comes to clinic with complaints of Rt knee pain s/p recent Rt TKA with mobility, strength and movement coordination deficits that impair their ability to perform usual daily and recreational functional activities without increase difficulty/symptoms at this time.  Patient to benefit from skilled PT services to address impairments and limitations to improve to previous level of function without restriction secondary to condition.   OBJECTIVE IMPAIRMENTS: Abnormal gait, decreased activity tolerance, decreased balance, decreased coordination, decreased endurance, decreased mobility, difficulty walking, decreased ROM, decreased strength, hypomobility, increased edema, increased fascial restrictions, impaired perceived functional ability, impaired flexibility, improper body mechanics, and pain.   ACTIVITY LIMITATIONS: carrying, lifting, bending, sitting, standing, squatting, sleeping, stairs, transfers, bed mobility, and locomotion level  PARTICIPATION LIMITATIONS: meal prep, cleaning, interpersonal relationship, shopping, community activity, occupation, and yard work  PERSONAL FACTORS:  HTN, arthritis  are also affecting patient's functional outcome.   REHAB POTENTIAL: Good  CLINICAL DECISION MAKING: Stable/uncomplicated  EVALUATION COMPLEXITY: Low   GOALS: Goals reviewed with patient? Yes  SHORT TERM GOALS: (target date for Short term goals are 3 weeks 06/20/2022)   1.  Patient will demonstrate independent use of home  exercise program to maintain progress from in clinic treatments.  Goal status: New  LONG TERM GOALS: (target dates for all long term goals are 10 weeks  08/08/2022 )   1. Patient will demonstrate/report pain at worst less than or equal to 2/10 to facilitate minimal limitation in daily activity secondary to pain symptoms.  Goal status: New   2. Patient will demonstrate independent use of home exercise program to facilitate ability to maintain/progress functional gains from skilled physical therapy services.  Goal status: New   3. Patient will demonstrate FOTO outcome > or = 68 % to indicate reduced disability due to condition.  Goal status: New   4.  Patient will demonstrate Rt LE MMT 5/5 throughout to faciltiate usual transfers, stairs, squatting at [facility] for daily life.   Goal status: New   5.  Patient will demonstrate Rt knee AROM 0-110 deg to facilitate usual mobility in transfers, ambulation and daily activity.  Goal status: New   6.  Patient will demonstrate independent ambulation s deviation community distances > 300 ft.  Goal status: New   7.  Patient will demonstrate ascending/descending stairs reciprocally without hand assist for household navigation.  Goal Status: New   PLAN:  PT FREQUENCY: 2x/week  PT DURATION: 10 weeks  PLANNED INTERVENTIONS: Therapeutic exercises, Therapeutic activity, Neuro Muscular re-education, Balance training, Gait training, Patient/Family education, Joint mobilization, Stair training, DME instructions, Dry Needling, Electrical stimulation, Traction, Cryotherapy, vasopneumatic deviceMoist heat, Taping, Ultrasound, Ionotophoresis 4mg /ml Dexamethasone, and Manual therapy.  All included unless contraindicated  PLAN FOR NEXT SESSION: Review HEP knowledge/results.  Progressive mobility improvements for knee with manual/therex, early quad strengthening, static balance.   , PT, DPT, OCS, ATC 05/30/22  9:37 AM

## 2022-05-28 NOTE — Progress Notes (Signed)
   Post-Op Visit Note   Patient: Heather Diaz           Date of Birth: Jun 30, 1961           MRN: [id number] Visit Date: 05/28/2022 PCP: Harlan Stains, MD   Assessment & Plan:  Chief Complaint:  Chief Complaint  Patient presents with   Right Knee - Routine Post Op   Visit Diagnoses:  1. Status post total right knee replacement     Plan: Terris is 2 weeks status post right total knee replacement on 05/13/2022.  In regards to the knee she is doing quite well.  She uses the walker for ambulating long distances.  She is very upset about her experience while she was at the hospital with the nursing care in particular.  Examination right knee shows healed surgical incisions.  She has a couple areas that look like a suture abscess.  There is no evidence of infection otherwise.  Range of motion is acceptable.  Expected postoperative changes.  Sutures removed and Steri-Strips applied.  I will place her on 10 days of cefadroxil for the suture abscesses.  I will make a referral to outpatient PT at our office.  Continue out of work.  Instructions reviewed, implant card provided, handicap placard provided.  Recheck in 4 weeks with two-view x-rays of the right knee.  Follow-Up Instructions: Return in about 4 weeks (around 06/25/2022).   Orders:  Orders Placed This Encounter  Procedures   Ambulatory referral to Physical Therapy   No orders of the defined types were placed in this encounter.   Imaging: No results found.  PMFS History: Patient Active Problem List   Diagnosis Date Noted   Primary osteoarthritis of right knee 05/13/2022   Status post total right knee replacement 05/13/2022   Acute medial meniscus tear of left knee 12/22/2020   Acute tear lateral meniscus, left, initial encounter 12/22/2020   Past Medical History:  Diagnosis Date   Anemia    thalacemia   Anxiety    Arthritis    Hypertension     No family history on file.  Past Surgical History:  Procedure  Laterality Date   ABDOMINAL HYSTERECTOMY     KNEE ARTHROSCOPY WITH MEDIAL MENISECTOMY Left 12/22/2020   Procedure: LEFT KNEE ARTHROSCOPY WITH PARTIAL MEDIAL MENISECTOMY;  Surgeon: Leandrew Koyanagi, MD;  Location: [facility];  Service: Orthopedics;  Laterality: Left;   KNEE ARTHROSCOPY WITH MEDIAL MENISECTOMY Right 10/24/2021   Procedure: right knee arthroscopy partial medial and partial lateral meniscectomy and synovectomy;  Surgeon: Leandrew Koyanagi, MD;  Location: [place] [facility];  Service: Orthopedics;  Laterality: Right;   REDUCTION MAMMAPLASTY Bilateral    TOTAL KNEE ARTHROPLASTY Right 05/13/2022   Procedure: TOTAL KNEE ARTHROPLASTY;  Surgeon: Leandrew Koyanagi, MD;  Location: [place];  Service: Orthopedics;  Laterality: Right;   Social History   Occupational History   Not on file  Tobacco Use   Smoking status: Never   Smokeless tobacco: Never  Vaping Use   Vaping Use: Never used  Substance and Sexual Activity   Alcohol use: Yes    Alcohol/week: 3.0 standard drinks of alcohol    Types: 3 Glasses of wine per week    Comment: maybe 1 glass of wine  2-3 times a week   Drug use: Never   Sexual activity: Not Currently    Birth control/protection: Surgical

## 2022-05-29 ENCOUNTER — Other Ambulatory Visit (HOSPITAL_COMMUNITY): Payer: Self-pay

## 2022-05-30 ENCOUNTER — Encounter: Payer: Self-pay | Admitting: Rehabilitative and Restorative Service Providers"

## 2022-05-30 ENCOUNTER — Other Ambulatory Visit: Payer: Self-pay

## 2022-05-30 ENCOUNTER — Ambulatory Visit: Payer: 59 | Admitting: Rehabilitative and Restorative Service Providers"

## 2022-05-30 DIAGNOSIS — M6281 Muscle weakness (generalized): Secondary | ICD-10-CM

## 2022-05-30 DIAGNOSIS — M25561 Pain in right knee: Secondary | ICD-10-CM

## 2022-05-30 DIAGNOSIS — G8929 Other chronic pain: Secondary | ICD-10-CM | POA: Diagnosis not present

## 2022-05-30 DIAGNOSIS — R262 Difficulty in walking, not elsewhere classified: Secondary | ICD-10-CM | POA: Diagnosis not present

## 2022-05-30 DIAGNOSIS — R600 Localized edema: Secondary | ICD-10-CM | POA: Diagnosis not present

## 2022-06-05 ENCOUNTER — Other Ambulatory Visit (HOSPITAL_COMMUNITY): Payer: Self-pay

## 2022-06-05 ENCOUNTER — Other Ambulatory Visit: Payer: Self-pay | Admitting: Physician Assistant

## 2022-06-05 ENCOUNTER — Encounter: Payer: Self-pay | Admitting: Physical Therapy

## 2022-06-05 ENCOUNTER — Ambulatory Visit (INDEPENDENT_AMBULATORY_CARE_PROVIDER_SITE_OTHER): Payer: 59 | Admitting: Physical Therapy

## 2022-06-05 DIAGNOSIS — R262 Difficulty in walking, not elsewhere classified: Secondary | ICD-10-CM

## 2022-06-05 DIAGNOSIS — M25561 Pain in right knee: Secondary | ICD-10-CM

## 2022-06-05 DIAGNOSIS — R600 Localized edema: Secondary | ICD-10-CM | POA: Diagnosis not present

## 2022-06-05 DIAGNOSIS — G8929 Other chronic pain: Secondary | ICD-10-CM

## 2022-06-05 DIAGNOSIS — M6281 Muscle weakness (generalized): Secondary | ICD-10-CM

## 2022-06-05 NOTE — Therapy (Signed)
OUTPATIENT PHYSICAL THERAPY TREATMENT   Patient Name: Heather Diaz MRN: [id number] DOB:1960-12-07, 61 y.o., female Today's Date: 06/05/2022  END OF SESSION:   PT End of Session - 06/05/22 0916     Visit Number 2    Number of Visits 20    Date for PT Re-Evaluation 08/08/22    Authorization Type [facility] $25 copay    Progress Note Due on Visit 10    PT Start Time 0859   pt late   PT Stop Time 0927    PT Time Calculation (min) 28 min    Activity Tolerance Patient tolerated treatment well    Behavior During Therapy [facility] for tasks assessed/performed              Past Medical History:  Diagnosis Date   Anemia    thalacemia   Anxiety    Arthritis    Hypertension    Past Surgical History:  Procedure Laterality Date   ABDOMINAL HYSTERECTOMY     KNEE ARTHROSCOPY WITH MEDIAL MENISECTOMY Left 12/22/2020   Procedure: LEFT KNEE ARTHROSCOPY WITH PARTIAL MEDIAL MENISECTOMY;  Surgeon: Leandrew Koyanagi, MD;  Location: [place];  Service: Orthopedics;  Laterality: Left;   KNEE ARTHROSCOPY WITH MEDIAL MENISECTOMY Right 10/24/2021   Procedure: right knee arthroscopy partial medial and partial lateral meniscectomy and synovectomy;  Surgeon: Leandrew Koyanagi, MD;  Location: [place];  Service: Orthopedics;  Laterality: Right;   REDUCTION MAMMAPLASTY Bilateral    TOTAL KNEE ARTHROPLASTY Right 05/13/2022   Procedure: TOTAL KNEE ARTHROPLASTY;  Surgeon: Leandrew Koyanagi, MD;  Location: [place];  Service: Orthopedics;  Laterality: Right;   Patient Active Problem List   Diagnosis Date Noted   Primary osteoarthritis of right knee 05/13/2022   Status post total right knee replacement 05/13/2022   Acute medial meniscus tear of left knee 12/22/2020   Acute tear lateral meniscus, left, initial encounter 12/22/2020    PCP: Harlan Stains MD  REFERRING PROVIDER: Leandrew Koyanagi, MD  REFERRING DIAG: [phone] (ICD-10-CM) - Status post total right knee replacement  THERAPY  DIAG:  Chronic pain of right knee  Muscle weakness (generalized)  Difficulty in walking, not elsewhere classified  Localized edema  Rationale for Evaluation and Treatment Rehabilitation  ONSET DATE: 05/13/2022 surgery TKA  SUBJECTIVE:   SUBJECTIVE STATEMENT:  There's been a lot going on with this knee, first I had an arthroscopy, then a bakers cyst, then a meniscus repair and then a TKR. My mind was blown when I had the knee replacement. I have the bakers cyst going on still its making this whole process more difficult for me. Most people think they're dealing with a straight forward TKR but I'm more complicated than that I feel.   PERTINENT HISTORY: History of Lt knee surgery  PAIN:  NPRS scale: at current 3.5-4/10 Pain location: Rt knee Pain description: "I think the pain is from where my stitches were taken out", feels tight, pain in incision not deep in knee  Aggravating factors: sleeping position being off/being in awkward position during sleep Relieving factors: pain medicine, icing  PRECAUTIONS: None  WEIGHT BEARING RESTRICTIONS: No  FALLS:  Has patient fallen in last 6 months? No  LIVING ENVIRONMENT: Lives in: House/apartment Stairs: 5 stairs to enter , rail on Rt side.    OCCUPATION: Full time at [employer] ED (standing, walking, sitting).  Walking for exercise.   PLOF: Independent, housework, have dog.   PATIENT GOALS: Reduce pain, walk and  normal daily living without pain.    OBJECTIVE:    PATIENT SURVEYS:  05/30/2022 FOTO intake:  64  predicted:  68  COGNITION: 05/30/2022 Overall cognitive status: WFL    SENSATION: 05/30/2022 WFL  EDEMA:  05/30/2022 Localized edema observed Rt knee/leg  MUSCLE LENGTH: 05/30/2022 No specific testing  POSTURE:  05/30/2022 Weight shift to Lt in standing.   PALPATION: 05/30/2022 Mild tenderness joint lines, peri  incision  LOWER EXTREMITY ROM:   ROM Right 05/30/2022 Left 05/30/2022  Hip flexion     Hip extension    Hip abduction    Hip adduction    Hip internal rotation    Hip external rotation    Knee flexion 105 AROM in supine heel slide   Knee extension -19 AROM in seated LAQ, heel prop PROM -10    Ankle dorsiflexion    Ankle plantarflexion    Ankle inversion    Ankle eversion     (Blank rows = not tested)  LOWER EXTREMITY MMT:  MMT Right 05/30/2022 Left 05/30/2022  Hip flexion 5/5 5/5  Hip extension    Hip abduction    Hip adduction    Hip internal rotation    Hip external rotation    Knee flexion 4/5 5/5  Knee extension 2/5 5/5  Ankle dorsiflexion 5/5 5/5  Ankle plantarflexion    Ankle inversion    Ankle eversion     (Blank rows = not tested)  LOWER EXTREMITY SPECIAL TESTS:  05/30/2022 No specific testing  FUNCTIONAL TESTS:  05/30/2022 18 inch chair transfer: able to perform s UE assist but noted deviation to WB on Lt leg Lt SLS: 15 seconds Rt SLS: < 3 seconds  GAIT: 05/30/2022 Independent ambulation c antalgic gait, maintained knee flexion in stance on Rt, reduced stance on Rt, reduced step length on Lt.    TODAY'S TREATMENT                                                                          DATE:   06/05/22  Manual  Retrograde massage with LEs elevated for edema control  Knee ROM overpressure in extension supine with heel prop x10 Patella mobs grade III all directions STM to R quad with LE elevated with tennis ball    05/30/2022 Therex:    HEP instruction/performance c cues for techniques, handout provided.  Trial set performed of each for comprehension and symptom assessment.  See below for exercise list  PATIENT EDUCATION:  05/30/2022 Education details: HEP, POC Person educated: Patient Education method: Explanation, Demonstration, Verbal cues, and Handouts Education comprehension: verbalized understanding, returned demonstration, and verbal cues required  HOME EXERCISE PROGRAM: Access Code: [email or web address] URL:  [email or web address] Date: 05/30/2022 Prepared by: Chyrel Masson  Exercises - Seated Quad Set  - 3-5 x daily - 7 x weekly - 1 sets - 10 reps - 5 hold - Supine Heel Slide with Strap  - 3-5 x daily - 7 x weekly - 1 sets - 10 reps - 5 hold - Seated Long Arc Quad  - 3-5 x daily - 7 x weekly - 1-2 sets - 10 reps - 2 hold - Supine Knee Extension Mobilization with Weight  - 3-5 x daily - 7 x weekly -  1 sets - 1 reps - to tolerance up to 15 mins hold - Seated Passive Knee Extension (Mirrored)  - 3-5 x daily - 7 x weekly - 1 sets - 5 reps - to tolerance hold  ASSESSMENT:  CLINICAL IMPRESSION:  Ms. Mudry arrives today doing OK, did arrive somewhat late. Provided education on pain management, role of PT in recovering from TKR and progressive mobility. Focused on manual techniques for edema, ROM, and management of pain and mm spasms this morning. Session limited by time restrictions. Will progress as able and tolerated moving forward.   OBJECTIVE IMPAIRMENTS: Abnormal gait, decreased activity tolerance, decreased balance, decreased coordination, decreased endurance, decreased mobility, difficulty walking, decreased ROM, decreased strength, hypomobility, increased edema, increased fascial restrictions, impaired perceived functional ability, impaired flexibility, improper body mechanics, and pain.   ACTIVITY LIMITATIONS: carrying, lifting, bending, sitting, standing, squatting, sleeping, stairs, transfers, bed mobility, and locomotion level  PARTICIPATION LIMITATIONS: meal prep, cleaning, interpersonal relationship, shopping, community activity, occupation, and yard work  PERSONAL FACTORS:  HTN, arthritis  are also affecting patient's functional outcome.   REHAB POTENTIAL: Good  CLINICAL DECISION MAKING: Stable/uncomplicated  EVALUATION COMPLEXITY: Low   GOALS: Goals reviewed with patient? Yes  SHORT TERM GOALS: (target date for Short term goals are 3 weeks 06/20/2022)   1.   Patient will demonstrate independent use of home exercise program to maintain progress from in clinic treatments.  Goal status: New  LONG TERM GOALS: (target dates for all long term goals are 10 weeks  08/08/2022 )   1. Patient will demonstrate/report pain at worst less than or equal to 2/10 to facilitate minimal limitation in daily activity secondary to pain symptoms.  Goal status: New   2. Patient will demonstrate independent use of home exercise program to facilitate ability to maintain/progress functional gains from skilled physical therapy services.  Goal status: New   3. Patient will demonstrate FOTO outcome > or = 68 % to indicate reduced disability due to condition.  Goal status: New   4.  Patient will demonstrate Rt LE MMT 5/5 throughout to faciltiate usual transfers, stairs, squatting at [facility] for daily life.   Goal status: New   5.  Patient will demonstrate Rt knee AROM 0-110 deg to facilitate usual mobility in transfers, ambulation and daily activity.  Goal status: New   6.  Patient will demonstrate independent ambulation s deviation community distances > 300 ft.  Goal status: New   7.  Patient will demonstrate ascending/descending stairs reciprocally without hand assist for household navigation.  Goal Status: New   PLAN:  PT FREQUENCY: 2x/week  PT DURATION: 10 weeks  PLANNED INTERVENTIONS: Therapeutic exercises, Therapeutic activity, Neuro Muscular re-education, Balance training, Gait training, Patient/Family education, Joint mobilization, Stair training, DME instructions, Dry Needling, Electrical stimulation, Traction, Cryotherapy, vasopneumatic deviceMoist heat, Taping, Ultrasound, Ionotophoresis 4mg /ml Dexamethasone, and Manual therapy.  All included unless contraindicated  PLAN FOR NEXT SESSION: Review HEP knowledge/results.  Progressive mobility improvements for knee with manual/therex, early quad strengthening, static balance.    Lineth Thielke U PT DPT PN2   06/05/2022, 9:28 AM

## 2022-06-06 ENCOUNTER — Other Ambulatory Visit: Payer: Self-pay | Admitting: Physician Assistant

## 2022-06-06 ENCOUNTER — Telehealth: Payer: Self-pay | Admitting: Orthopaedic Surgery

## 2022-06-06 ENCOUNTER — Other Ambulatory Visit (HOSPITAL_COMMUNITY): Payer: Self-pay

## 2022-06-06 ENCOUNTER — Other Ambulatory Visit (HOSPITAL_BASED_OUTPATIENT_CLINIC_OR_DEPARTMENT_OTHER): Payer: Self-pay

## 2022-06-06 MED ORDER — OXYCODONE-ACETAMINOPHEN 5-325 MG PO TABS
1.0000 | ORAL_TABLET | Freq: Two times a day (BID) | ORAL | 0 refills | Status: DC | PRN
  Filled 2022-06-06: qty 30, 8d supply, fill #0

## 2022-06-06 NOTE — Telephone Encounter (Signed)
I sent this in a few hours ago

## 2022-06-06 NOTE — Telephone Encounter (Signed)
Pt called requesting a refill of oxycodone. Pt states she need it for her physical therapy session which causes the pains. Please send to pharmacy on file. Pt phone number is [phone].

## 2022-06-06 NOTE — Telephone Encounter (Signed)
Called and notified patient.

## 2022-06-07 ENCOUNTER — Encounter: Payer: Self-pay | Admitting: Physical Therapy

## 2022-06-07 ENCOUNTER — Ambulatory Visit: Payer: 59 | Admitting: Physical Therapy

## 2022-06-07 DIAGNOSIS — R600 Localized edema: Secondary | ICD-10-CM | POA: Diagnosis not present

## 2022-06-07 DIAGNOSIS — M6281 Muscle weakness (generalized): Secondary | ICD-10-CM | POA: Diagnosis not present

## 2022-06-07 DIAGNOSIS — M25561 Pain in right knee: Secondary | ICD-10-CM | POA: Diagnosis not present

## 2022-06-07 DIAGNOSIS — G8929 Other chronic pain: Secondary | ICD-10-CM

## 2022-06-07 DIAGNOSIS — R262 Difficulty in walking, not elsewhere classified: Secondary | ICD-10-CM

## 2022-06-07 NOTE — Therapy (Signed)
OUTPATIENT PHYSICAL THERAPY TREATMENT   Patient Name: Heather Diaz MRN: [id number] DOB:1961/04/15, 61 y.o., female Today's Date: 06/07/2022  END OF SESSION:   PT End of Session - 06/07/22 0908     Visit Number 3    Number of Visits 20    Date for PT Re-Evaluation 08/08/22    Authorization Type [facility] $25 copay    Progress Note Due on Visit 10    PT Start Time 0850   pt few minute late   PT Stop Time 0928    PT Time Calculation (min) 38 min    Activity Tolerance Patient tolerated treatment well    Behavior During Therapy [facility] for tasks assessed/performed               Past Medical History:  Diagnosis Date   Anemia    thalacemia   Anxiety    Arthritis    Hypertension    Past Surgical History:  Procedure Laterality Date   ABDOMINAL HYSTERECTOMY     KNEE ARTHROSCOPY WITH MEDIAL MENISECTOMY Left 12/22/2020   Procedure: LEFT KNEE ARTHROSCOPY WITH PARTIAL MEDIAL MENISECTOMY;  Surgeon: Leandrew Koyanagi, MD;  Location: [place];  Service: Orthopedics;  Laterality: Left;   KNEE ARTHROSCOPY WITH MEDIAL MENISECTOMY Right 10/24/2021   Procedure: right knee arthroscopy partial medial and partial lateral meniscectomy and synovectomy;  Surgeon: Leandrew Koyanagi, MD;  Location: [place];  Service: Orthopedics;  Laterality: Right;   REDUCTION MAMMAPLASTY Bilateral    TOTAL KNEE ARTHROPLASTY Right 05/13/2022   Procedure: TOTAL KNEE ARTHROPLASTY;  Surgeon: Leandrew Koyanagi, MD;  Location: [place];  Service: Orthopedics;  Laterality: Right;   Patient Active Problem List   Diagnosis Date Noted   Primary osteoarthritis of right knee 05/13/2022   Status post total right knee replacement 05/13/2022   Acute medial meniscus tear of left knee 12/22/2020   Acute tear lateral meniscus, left, initial encounter 12/22/2020    PCP: Harlan Stains MD  REFERRING PROVIDER: Leandrew Koyanagi, MD  REFERRING DIAG: [phone] (ICD-10-CM) - Status post total right knee  replacement  THERAPY DIAG:  Chronic pain of right knee  Muscle weakness (generalized)  Difficulty in walking, not elsewhere classified  Localized edema  Rationale for Evaluation and Treatment Rehabilitation  ONSET DATE: 05/13/2022 surgery TKA  SUBJECTIVE:   SUBJECTIVE STATEMENT:  I felt a little rough after PT last time, but I felt better the next day. Cold weather isn't helping. Still having some pain when I wake up because the leg moved into an uncomfortable position in sleep.    PERTINENT HISTORY: History of Lt knee surgery  PAIN:  NPRS scale: at current 2/10 Pain location: Rt knee Pain description: more of an ache today Aggravating factors: sleeping position being off/being in awkward position during sleep Relieving factors: pain medicine, icing  PRECAUTIONS: None  WEIGHT BEARING RESTRICTIONS: No  FALLS:  Has patient fallen in last 6 months? No  LIVING ENVIRONMENT: Lives in: House/apartment Stairs: 5 stairs to enter , rail on Rt side.    OCCUPATION: Full time at [employer] ED (standing, walking, sitting).  Walking for exercise.   PLOF: Independent, housework, have dog.   PATIENT GOALS: Reduce pain, walk and normal daily living without pain.    OBJECTIVE:    PATIENT SURVEYS:  05/30/2022 FOTO intake:  64  predicted:  68  COGNITION: 05/30/2022 Overall cognitive status: WFL    SENSATION: 05/30/2022 WFL  EDEMA:  05/30/2022 Localized edema observed Rt knee/leg  MUSCLE LENGTH: 05/30/2022 No specific testing  POSTURE:  05/30/2022 Weight shift to Lt in standing.   PALPATION: 05/30/2022 Mild tenderness joint lines, peri  incision  LOWER EXTREMITY ROM:   ROM Right 05/30/2022 Left 05/30/2022 L 06/07/22  Hip flexion     Hip extension     Hip abduction     Hip adduction     Hip internal rotation     Hip external rotation     Knee flexion 105 AROM in supine heel slide  Seated 110 AROM   Knee extension -19 AROM in seated LAQ, heel prop PROM  -10   Supine with heel prop/quad set 14 AROM  Ankle dorsiflexion     Ankle plantarflexion     Ankle inversion     Ankle eversion      (Blank rows = not tested)  LOWER EXTREMITY MMT:  MMT Right 05/30/2022 Left 05/30/2022  Hip flexion 5/5 5/5  Hip extension    Hip abduction    Hip adduction    Hip internal rotation    Hip external rotation    Knee flexion 4/5 5/5  Knee extension 2/5 5/5  Ankle dorsiflexion 5/5 5/5  Ankle plantarflexion    Ankle inversion    Ankle eversion     (Blank rows = not tested)  LOWER EXTREMITY SPECIAL TESTS:  05/30/2022 No specific testing  FUNCTIONAL TESTS:  05/30/2022 18 inch chair transfer: able to perform s UE assist but noted deviation to WB on Lt leg Lt SLS: 15 seconds Rt SLS: < 3 seconds  GAIT: 05/30/2022 Independent ambulation c antalgic gait, maintained knee flexion in stance on Rt, reduced stance on Rt, reduced step length on Lt.    TODAY'S TREATMENT                                                                          DATE:    06/07/22  TherEx  Scifit bike seat 8 x6 minutes full rotations  Quad sets 1x15 3 second holds SAQs 2.5# x12 2 second holds Brid[place] x10  LAQs 2.5# 1x12 with 3 second holds   Manual  Knee extension and flexion OP  Retrograde massage LEs elevated  Tennis ball STM R quad with LEs elevated    06/05/22  Manual  Retrograde massage with LEs elevated for edema control  Knee ROM overpressure in extension supine with heel prop x10 Patella mobs grade III all directions STM to R quad with LE elevated with tennis ball    05/30/2022 Therex:    HEP instruction/performance c cues for techniques, handout provided.  Trial set performed of each for comprehension and symptom assessment.  See below for exercise list  PATIENT EDUCATION:  05/30/2022 Education details: HEP, POC Person educated: Patient Education method: Explanation, Demonstration, Verbal cues, and Handouts Education comprehension:  verbalized understanding, returned demonstration, and verbal cues required  HOME EXERCISE PROGRAM: Access Code: [email or web address] URL: [email or web address] Date: 05/30/2022 Prepared by: Scot Jun  Exercises - Seated Quad Set  - 3-5 x daily - 7 x weekly - 1 sets - 10 reps - 5 hold - Supine Heel Slide with Strap  - 3-5 x daily - 7 x weekly - 1 sets - 10 reps - 5 hold -  Seated Long Arc Quad  - 3-5 x daily - 7 x weekly - 1-2 sets - 10 reps - 2 hold - Supine Knee Extension Mobilization with Weight  - 3-5 x daily - 7 x weekly - 1 sets - 1 reps - to tolerance up to 15 mins hold - Seated Passive Knee Extension (Mirrored)  - 3-5 x daily - 7 x weekly - 1 sets - 5 reps - to tolerance hold  ASSESSMENT:  CLINICAL IMPRESSION:  Ms. Bedwell arrives today doing OK, pain better today. Warmed up on Scifit bike for ROM, otherwise continued with POC with general focus on ROM, quad strength in [place] and [facility] positions, and ongoing manual techniques for ROM/pain control. Slightly self limiting at times but participated well with encouragement, seems to be making steady progress forward.   OBJECTIVE IMPAIRMENTS: Abnormal gait, decreased activity tolerance, decreased balance, decreased coordination, decreased endurance, decreased mobility, difficulty walking, decreased ROM, decreased strength, hypomobility, increased edema, increased fascial restrictions, impaired perceived functional ability, impaired flexibility, improper body mechanics, and pain.   ACTIVITY LIMITATIONS: carrying, lifting, bending, sitting, standing, squatting, sleeping, stairs, transfers, bed mobility, and locomotion level  PARTICIPATION LIMITATIONS: meal prep, cleaning, interpersonal relationship, shopping, community activity, occupation, and yard work  PERSONAL FACTORS:  HTN, arthritis  are also affecting patient's functional outcome.   REHAB POTENTIAL: Good  CLINICAL DECISION MAKING: Stable/uncomplicated  EVALUATION COMPLEXITY:  Low   GOALS: Goals reviewed with patient? Yes  SHORT TERM GOALS: (target date for Short term goals are 3 weeks 06/20/2022)   1.  Patient will demonstrate independent use of home exercise program to maintain progress from in clinic treatments.  Goal status: New  LONG TERM GOALS: (target dates for all long term goals are 10 weeks  08/08/2022 )   1. Patient will demonstrate/report pain at worst less than or equal to 2/10 to facilitate minimal limitation in daily activity secondary to pain symptoms.  Goal status: New   2. Patient will demonstrate independent use of home exercise program to facilitate ability to maintain/progress functional gains from skilled physical therapy services.  Goal status: New   3. Patient will demonstrate FOTO outcome > or = 68 % to indicate reduced disability due to condition.  Goal status: New   4.  Patient will demonstrate Rt LE MMT 5/5 throughout to faciltiate usual transfers, stairs, squatting at [facility] for daily life.   Goal status: New   5.  Patient will demonstrate Rt knee AROM 0-110 deg to facilitate usual mobility in transfers, ambulation and daily activity.  Goal status: New   6.  Patient will demonstrate independent ambulation s deviation community distances > 300 ft.  Goal status: New   7.  Patient will demonstrate ascending/descending stairs reciprocally without hand assist for household navigation.  Goal Status: New   PLAN:  PT FREQUENCY: 2x/week  PT DURATION: 10 weeks  PLANNED INTERVENTIONS: Therapeutic exercises, Therapeutic activity, Neuro Muscular re-education, Balance training, Gait training, Patient/Family education, Joint mobilization, Stair training, DME instructions, Dry Needling, Electrical stimulation, Traction, Cryotherapy, vasopneumatic deviceMoist heat, Taping, Ultrasound, Ionotophoresis 4mg /ml Dexamethasone, and Manual therapy.  All included unless contraindicated  PLAN FOR NEXT SESSION: Review HEP knowledge/results.   Progressive mobility improvements for knee with manual/therex, early quad strengthening, static balance.    Javarian Jakubiak U PT DPT PN2  06/07/2022, 9:28 AM

## 2022-06-10 ENCOUNTER — Other Ambulatory Visit (HOSPITAL_COMMUNITY): Payer: Self-pay

## 2022-06-11 ENCOUNTER — Ambulatory Visit: Payer: 59 | Admitting: Physical Therapy

## 2022-06-11 ENCOUNTER — Encounter: Payer: Self-pay | Admitting: Physical Therapy

## 2022-06-11 DIAGNOSIS — M6281 Muscle weakness (generalized): Secondary | ICD-10-CM | POA: Diagnosis not present

## 2022-06-11 DIAGNOSIS — R262 Difficulty in walking, not elsewhere classified: Secondary | ICD-10-CM

## 2022-06-11 DIAGNOSIS — R600 Localized edema: Secondary | ICD-10-CM

## 2022-06-11 DIAGNOSIS — G8929 Other chronic pain: Secondary | ICD-10-CM | POA: Diagnosis not present

## 2022-06-11 DIAGNOSIS — M25561 Pain in right knee: Secondary | ICD-10-CM | POA: Diagnosis not present

## 2022-06-11 NOTE — Therapy (Signed)
OUTPATIENT PHYSICAL THERAPY TREATMENT   Patient Name: Heather Diaz MRN: [id number] DOB:1960/09/27, 61 y.o., female Today's Date: 06/11/2022  END OF SESSION:   PT End of Session - 06/11/22 0853     Visit Number 4    Number of Visits 20    Date for PT Re-Evaluation 08/08/22    Authorization Type [facility] $25 copay    Progress Note Due on Visit 10    PT Start Time 0846    PT Stop Time 0924    PT Time Calculation (min) 38 min    Activity Tolerance Patient tolerated treatment well    Behavior During Therapy [facility] for tasks assessed/performed                Past Medical History:  Diagnosis Date   Anemia    thalacemia   Anxiety    Arthritis    Hypertension    Past Surgical History:  Procedure Laterality Date   ABDOMINAL HYSTERECTOMY     KNEE ARTHROSCOPY WITH MEDIAL MENISECTOMY Left 12/22/2020   Procedure: LEFT KNEE ARTHROSCOPY WITH PARTIAL MEDIAL MENISECTOMY;  Surgeon: Leandrew Koyanagi, MD;  Location: [place];  Service: Orthopedics;  Laterality: Left;   KNEE ARTHROSCOPY WITH MEDIAL MENISECTOMY Right 10/24/2021   Procedure: right knee arthroscopy partial medial and partial lateral meniscectomy and synovectomy;  Surgeon: Leandrew Koyanagi, MD;  Location: [place];  Service: Orthopedics;  Laterality: Right;   REDUCTION MAMMAPLASTY Bilateral    TOTAL KNEE ARTHROPLASTY Right 05/13/2022   Procedure: TOTAL KNEE ARTHROPLASTY;  Surgeon: Leandrew Koyanagi, MD;  Location: [place];  Service: Orthopedics;  Laterality: Right;   Patient Active Problem List   Diagnosis Date Noted   Primary osteoarthritis of right knee 05/13/2022   Status post total right knee replacement 05/13/2022   Acute medial meniscus tear of left knee 12/22/2020   Acute tear lateral meniscus, left, initial encounter 12/22/2020    PCP: Harlan Stains MD  REFERRING PROVIDER: Leandrew Koyanagi, MD  REFERRING DIAG: [phone] (ICD-10-CM) - Status post total right knee replacement  THERAPY DIAG:   Chronic pain of right knee  Muscle weakness (generalized)  Difficulty in walking, not elsewhere classified  Localized edema  Rationale for Evaluation and Treatment Rehabilitation  ONSET DATE: 05/13/2022 surgery TKA  SUBJECTIVE:   SUBJECTIVE STATEMENT: Doing well today, took a pain pill before session.   PERTINENT HISTORY: History of Lt knee surgery  PAIN:  NPRS scale: at current 2/10 Pain location: Rt knee Pain description: more of an ache today Aggravating factors: sleeping position being off/being in awkward position during sleep Relieving factors: pain medicine, icing  PRECAUTIONS: None  WEIGHT BEARING RESTRICTIONS: No  FALLS:  Has patient fallen in last 6 months? No  LIVING ENVIRONMENT: Lives in: House/apartment Stairs: 5 stairs to enter , rail on Rt side.    OCCUPATION: Full time at [employer] ED (standing, walking, sitting).  Walking for exercise.   PLOF: Independent, housework, have dog.   PATIENT GOALS: Reduce pain, walk and normal daily living without pain.    OBJECTIVE:    PATIENT SURVEYS:  05/30/2022 FOTO intake:  64  predicted:  68  EDEMA:  05/30/2022 Localized edema observed Rt knee/leg  POSTURE:  05/30/2022 Weight shift to Lt in standing.   PALPATION: 05/30/2022 Mild tenderness joint lines, peri  incision  LOWER EXTREMITY ROM:   ROM Right 05/30/2022 Left 05/30/2022 L 06/07/22 Left 06/11/22  Hip flexion      Hip extension  Hip abduction      Hip adduction      Hip internal rotation      Hip external rotation      Knee flexion 105 AROM in supine heel slide  Seated 110 AROM  A: 111 AA: 119  Knee extension -19 AROM in seated LAQ, heel prop PROM -10   Supine with heel prop/quad set 14 AROM A: -13 (seated LAQ) P: -4  Ankle dorsiflexion      Ankle plantarflexion      Ankle inversion      Ankle eversion       (Blank rows = not tested)  LOWER EXTREMITY MMT:  MMT Right 05/30/2022 Left 05/30/2022  Hip flexion 5/5 5/5  Hip  extension    Hip abduction    Hip adduction    Hip internal rotation    Hip external rotation    Knee flexion 4/5 5/5  Knee extension 2/5 5/5  Ankle dorsiflexion 5/5 5/5  Ankle plantarflexion    Ankle inversion    Ankle eversion     (Blank rows = not tested)  LOWER EXTREMITY SPECIAL TESTS:  05/30/2022 No specific testing  FUNCTIONAL TESTS:  05/30/2022 18 inch chair transfer: able to perform s UE assist but noted deviation to WB on Lt leg Lt SLS: 15 seconds Rt SLS: < 3 seconds  GAIT: 05/30/2022 Independent ambulation c antalgic gait, maintained knee flexion in stance on Rt, reduced stance on Rt, reduced step length on Lt.    TODAY'S TREATMENT  06/11/22 TherEx SciFit bike seat 10 x 8 min; L2 Rt LAQ 2# 2x10; 3 sec hold Long sitting quad sets x 20 (performed bil with visual cues for improved quad activation) AA heel slides x 10 reps; Rt Leg Press bil push 75# 2x10; RLE only 31# 2x10 Knee extension machine 5# bil concentric, RLE only eccentric 2x10  06/07/22  TherEx  Scifit bike seat 8 x6 minutes full rotations  Quad sets 1x15 3 second holds SAQs 2.5# x12 2 second holds Bridges x10  LAQs 2.5# 1x12 with 3 second holds   Manual  Knee extension and flexion OP  Retrograde massage LEs elevated  Tennis ball STM R quad with LEs elevated    06/05/22  Manual  Retrograde massage with LEs elevated for edema control  Knee ROM overpressure in extension supine with heel prop x10 Patella mobs grade III all directions STM to R quad with LE elevated with tennis ball    05/30/2022 Therex:    HEP instruction/performance c cues for techniques, handout provided.  Trial set performed of each for comprehension and symptom assessment.  See below for exercise list  PATIENT EDUCATION:  05/30/2022 Education details: HEP, POC Person educated: Patient Education method: Explanation, Demonstration, Verbal cues, and Handouts Education comprehension: verbalized understanding,  returned demonstration, and verbal cues required  HOME EXERCISE PROGRAM: Access Code: [email or web address] URL: [email or web address] Date: 05/30/2022 Prepared by: Scot Jun  Exercises - Seated Quad Set  - 3-5 x daily - 7 x weekly - 1 sets - 10 reps - 5 hold - Supine Heel Slide with Strap  - 3-5 x daily - 7 x weekly - 1 sets - 10 reps - 5 hold - Seated Long Arc Quad  - 3-5 x daily - 7 x weekly - 1-2 sets - 10 reps - 2 hold - Supine Knee Extension Mobilization with Weight  - 3-5 x daily - 7 x weekly - 1 sets - 1 reps - to tolerance up to 15 mins  hold - Seated Passive Knee Extension (Mirrored)  - 3-5 x daily - 7 x weekly - 1 sets - 5 reps - to tolerance hold  ASSESSMENT:  CLINICAL IMPRESSION:  Pt with improvement in AROM today, and overall progressing well.  Still has difficulty with quad activation and TKE so encouraged quad focus at home as well as maintaining flexion.  Will continue to benefit from PT to maximize function.   OBJECTIVE IMPAIRMENTS: Abnormal gait, decreased activity tolerance, decreased balance, decreased coordination, decreased endurance, decreased mobility, difficulty walking, decreased ROM, decreased strength, hypomobility, increased edema, increased fascial restrictions, impaired perceived functional ability, impaired flexibility, improper body mechanics, and pain.   ACTIVITY LIMITATIONS: carrying, lifting, bending, sitting, standing, squatting, sleeping, stairs, transfers, bed mobility, and locomotion level  PARTICIPATION LIMITATIONS: meal prep, cleaning, interpersonal relationship, shopping, community activity, occupation, and yard work  PERSONAL FACTORS:  HTN, arthritis  are also affecting patient's functional outcome.   REHAB POTENTIAL: Good  CLINICAL DECISION MAKING: Stable/uncomplicated  EVALUATION COMPLEXITY: Low   GOALS: Goals reviewed with patient? Yes  SHORT TERM GOALS: (target date for Short term goals are 3 weeks 06/20/2022)   1.   Patient will demonstrate independent use of home exercise program to maintain progress from in clinic treatments.  Goal status: New  LONG TERM GOALS: (target dates for all long term goals are 10 weeks  08/08/2022 )   1. Patient will demonstrate/report pain at worst less than or equal to 2/10 to facilitate minimal limitation in daily activity secondary to pain symptoms.  Goal status: New   2. Patient will demonstrate independent use of home exercise program to facilitate ability to maintain/progress functional gains from skilled physical therapy services.  Goal status: New   3. Patient will demonstrate FOTO outcome > or = 68 % to indicate reduced disability due to condition.  Goal status: New   4.  Patient will demonstrate Rt LE MMT 5/5 throughout to faciltiate usual transfers, stairs, squatting at [facility] for daily life.   Goal status: New   5.  Patient will demonstrate Rt knee AROM 0-110 deg to facilitate usual mobility in transfers, ambulation and daily activity.  Goal status: New   6.  Patient will demonstrate independent ambulation s deviation community distances > 300 ft.  Goal status: New   7.  Patient will demonstrate ascending/descending stairs reciprocally without hand assist for household navigation.  Goal Status: New   PLAN:  PT FREQUENCY: 2x/week  PT DURATION: 10 weeks  PLANNED INTERVENTIONS: Therapeutic exercises, Therapeutic activity, Neuro Muscular re-education, Balance training, Gait training, Patient/Family education, Joint mobilization, Stair training, DME instructions, Dry Needling, Electrical stimulation, Traction, Cryotherapy, vasopneumatic deviceMoist heat, Taping, Ultrasound, Ionotophoresis 4mg /ml Dexamethasone, and Manual therapy.  All included unless contraindicated  PLAN FOR NEXT SESSION: continue to focus on quad strengthening,  balance, TKE   Laureen Abrahams, PT, DPT 06/11/22 9:26 AM

## 2022-06-13 ENCOUNTER — Encounter: Payer: 59 | Admitting: Rehabilitative and Restorative Service Providers"

## 2022-06-13 NOTE — Therapy (Incomplete)
OUTPATIENT PHYSICAL THERAPY TREATMENT   Patient Name: Heather Diaz MRN: [id number] DOB:01/23/1961, 61 y.o., female Today's Date: 06/13/2022  END OF SESSION:        Past Medical History:  Diagnosis Date   Anemia    thalacemia   Anxiety    Arthritis    Hypertension    Past Surgical History:  Procedure Laterality Date   ABDOMINAL HYSTERECTOMY     KNEE ARTHROSCOPY WITH MEDIAL MENISECTOMY Left 12/22/2020   Procedure: LEFT KNEE ARTHROSCOPY WITH PARTIAL MEDIAL MENISECTOMY;  Surgeon: Tarry Kos, MD;  Location: [facility];  Service: Orthopedics;  Laterality: Left;   KNEE ARTHROSCOPY WITH MEDIAL MENISECTOMY Right 10/24/2021   Procedure: right knee arthroscopy partial medial and partial lateral meniscectomy and synovectomy;  Surgeon: Tarry Kos, MD;  Location: [facility];  Service: Orthopedics;  Laterality: Right;   REDUCTION MAMMAPLASTY Bilateral    TOTAL KNEE ARTHROPLASTY Right 05/13/2022   Procedure: TOTAL KNEE ARTHROPLASTY;  Surgeon: Tarry Kos, MD;  Location: [facility];  Service: Orthopedics;  Laterality: Right;   Patient Active Problem List   Diagnosis Date Noted   Primary osteoarthritis of right knee 05/13/2022   Status post total right knee replacement 05/13/2022   Acute medial meniscus tear of left knee 12/22/2020   Acute tear lateral meniscus, left, initial encounter 12/22/2020    PCP: Laurann Montana MD  REFERRING PROVIDER: Tarry Kos, MD  REFERRING DIAG: [phone] (ICD-10-CM) - Status post total right knee replacement  THERAPY DIAG:  No diagnosis found.  Rationale for Evaluation and Treatment Rehabilitation  ONSET DATE: 05/13/2022 surgery TKA  SUBJECTIVE:   SUBJECTIVE STATEMENT: Doing well today, took a pain pill before session.   PERTINENT HISTORY: History of Lt knee surgery  PAIN:  NPRS scale: at current 2/10 Pain location: Rt knee Pain description: more of an ache today Aggravating factors: sleeping position being  off/being in awkward position during sleep Relieving factors: pain medicine, icing  PRECAUTIONS: None  WEIGHT BEARING RESTRICTIONS: No  FALLS:  Has patient fallen in last 6 months? No  LIVING ENVIRONMENT: Lives in: House/apartment Stairs: 5 stairs to enter , rail on Rt side.    OCCUPATION: Full time at [employer] ED (standing, walking, sitting).  Walking for exercise.   PLOF: Independent, housework, have dog.   PATIENT GOALS: Reduce pain, walk and normal daily living without pain.    OBJECTIVE:    PATIENT SURVEYS:  05/30/2022 FOTO intake:  64  predicted:  68  EDEMA:  05/30/2022 Localized edema observed Rt knee/leg  POSTURE:  05/30/2022 Weight shift to Lt in standing.   PALPATION: 05/30/2022 Mild tenderness joint lines, peri  incision  LOWER EXTREMITY ROM:   ROM Right 05/30/2022 Left 05/30/2022 L 06/07/22 Left 06/11/22  Hip flexion      Hip extension      Hip abduction      Hip adduction      Hip internal rotation      Hip external rotation      Knee flexion 105 AROM in supine heel slide  Seated 110 AROM  A: 111 AA: 119  Knee extension -19 AROM in seated LAQ, heel prop PROM -10   Supine with heel prop/quad set 14 AROM A: -13 (seated LAQ) P: -4  Ankle dorsiflexion      Ankle plantarflexion      Ankle inversion      Ankle eversion       (Blank rows = not tested)  LOWER EXTREMITY  MMT:  MMT Right 05/30/2022 Left 05/30/2022  Hip flexion 5/5 5/5  Hip extension    Hip abduction    Hip adduction    Hip internal rotation    Hip external rotation    Knee flexion 4/5 5/5  Knee extension 2/5 5/5  Ankle dorsiflexion 5/5 5/5  Ankle plantarflexion    Ankle inversion    Ankle eversion     (Blank rows = not tested)  LOWER EXTREMITY SPECIAL TESTS:  05/30/2022 No specific testing  FUNCTIONAL TESTS:  05/30/2022 18 inch chair transfer: able to perform s UE assist but noted deviation to WB on Lt leg Lt SLS: 15 seconds Rt SLS: < 3  seconds  GAIT: 05/30/2022 Independent ambulation c antalgic gait, maintained knee flexion in stance on Rt, reduced stance on Rt, reduced step length on Lt.    TODAY'S TREATMENT  06/13/2022: Therex:    Manual:     06/11/2022 TherEx SciFit bike seat 10 x 8 min; L2 Rt LAQ 2# 2x10; 3 sec hold Long sitting quad sets x 20 (performed bil with visual cues for improved quad activation) AA heel slides x 10 reps; Rt Leg Press bil push 75# 2x10; RLE only 31# 2x10 Knee extension machine 5# bil concentric, RLE only eccentric 2x10  06/07/2022  TherEx  Scifit bike seat 8 x6 minutes full rotations  Quad sets 1x15 3 second holds SAQs 2.5# x12 2 second holds Bridges x10  LAQs 2.5# 1x12 with 3 second holds   Manual  Knee extension and flexion OP  Retrograde massage LEs elevated  Tennis ball STM R quad with LEs elevated      PATIENT EDUCATION:  05/30/2022 Education details: HEP, POC Person educated: Patient Education method: Programmer, multimedia, Demonstration, Verbal cues, and Handouts Education comprehension: verbalized understanding, returned demonstration, and verbal cues required  HOME EXERCISE PROGRAM: Access Code: [email or web address] URL: [email or web address] Date: 05/30/2022 Prepared by: Chyrel Masson  Exercises - Seated Quad Set  - 3-5 x daily - 7 x weekly - 1 sets - 10 reps - 5 hold - Supine Heel Slide with Strap  - 3-5 x daily - 7 x weekly - 1 sets - 10 reps - 5 hold - Seated Long Arc Quad  - 3-5 x daily - 7 x weekly - 1-2 sets - 10 reps - 2 hold - Supine Knee Extension Mobilization with Weight  - 3-5 x daily - 7 x weekly - 1 sets - 1 reps - to tolerance up to 15 mins hold - Seated Passive Knee Extension (Mirrored)  - 3-5 x daily - 7 x weekly - 1 sets - 5 reps - to tolerance hold  ASSESSMENT:  CLINICAL IMPRESSION:  Pt with improvement in AROM today, and overall progressing well.  Still has difficulty with quad activation and TKE so encouraged quad focus at home  as well as maintaining flexion.  Will continue to benefit from PT to maximize function.   OBJECTIVE IMPAIRMENTS: Abnormal gait, decreased activity tolerance, decreased balance, decreased coordination, decreased endurance, decreased mobility, difficulty walking, decreased ROM, decreased strength, hypomobility, increased edema, increased fascial restrictions, impaired perceived functional ability, impaired flexibility, improper body mechanics, and pain.   ACTIVITY LIMITATIONS: carrying, lifting, bending, sitting, standing, squatting, sleeping, stairs, transfers, bed mobility, and locomotion level  PARTICIPATION LIMITATIONS: meal prep, cleaning, interpersonal relationship, shopping, community activity, occupation, and yard work  PERSONAL FACTORS:  HTN, arthritis  are also affecting patient's functional outcome.   REHAB POTENTIAL: Good  CLINICAL DECISION MAKING: Stable/uncomplicated  EVALUATION COMPLEXITY: Low  GOALS: Goals reviewed with patient? Yes  SHORT TERM GOALS: (target date for Short term goals are 3 weeks 06/20/2022)   1.  Patient will demonstrate independent use of home exercise program to maintain progress from in clinic treatments.  Goal status: New  LONG TERM GOALS: (target dates for all long term goals are 10 weeks  08/08/2022 )   1. Patient will demonstrate/report pain at worst less than or equal to 2/10 to facilitate minimal limitation in daily activity secondary to pain symptoms.  Goal status: New   2. Patient will demonstrate independent use of home exercise program to facilitate ability to maintain/progress functional gains from skilled physical therapy services.  Goal status: New   3. Patient will demonstrate FOTO outcome > or = 68 % to indicate reduced disability due to condition.  Goal status: New   4.  Patient will demonstrate Rt LE MMT 5/5 throughout to faciltiate usual transfers, stairs, squatting at [facility] for daily life.   Goal status: New   5.   Patient will demonstrate Rt knee AROM 0-110 deg to facilitate usual mobility in transfers, ambulation and daily activity.  Goal status: New   6.  Patient will demonstrate independent ambulation s deviation community distances > 300 ft.  Goal status: New   7.  Patient will demonstrate ascending/descending stairs reciprocally without hand assist for household navigation.  Goal Status: New   PLAN:  PT FREQUENCY: 2x/week  PT DURATION: 10 weeks  PLANNED INTERVENTIONS: Therapeutic exercises, Therapeutic activity, Neuro Muscular re-education, Balance training, Gait training, Patient/Family education, Joint mobilization, Stair training, DME instructions, Dry Needling, Electrical stimulation, Traction, Cryotherapy, vasopneumatic deviceMoist heat, Taping, Ultrasound, Ionotophoresis 4mg /ml Dexamethasone, and Manual therapy.  All included unless contraindicated  PLAN FOR NEXT SESSION: continue to focus on quad strengthening,  balance, TKE   , PT, DPT, OCS, ATC 06/13/22  7:38 AM

## 2022-06-14 ENCOUNTER — Encounter: Payer: 59 | Admitting: Rehabilitative and Restorative Service Providers"

## 2022-06-18 ENCOUNTER — Encounter: Payer: Self-pay | Admitting: Rehabilitative and Restorative Service Providers"

## 2022-06-18 ENCOUNTER — Ambulatory Visit: Payer: 59 | Admitting: Rehabilitative and Restorative Service Providers"

## 2022-06-18 DIAGNOSIS — R600 Localized edema: Secondary | ICD-10-CM

## 2022-06-18 DIAGNOSIS — G8929 Other chronic pain: Secondary | ICD-10-CM

## 2022-06-18 DIAGNOSIS — M6281 Muscle weakness (generalized): Secondary | ICD-10-CM

## 2022-06-18 DIAGNOSIS — R262 Difficulty in walking, not elsewhere classified: Secondary | ICD-10-CM

## 2022-06-18 DIAGNOSIS — M25561 Pain in right knee: Secondary | ICD-10-CM

## 2022-06-18 NOTE — Therapy (Signed)
OUTPATIENT PHYSICAL THERAPY TREATMENT   Patient Name: Heather Diaz MRN: [id number] DOB:August 07, 1960, 61 y.o., female Today's Date: 06/18/2022  END OF SESSION:   PT End of Session - 06/18/22 0942     Visit Number 5    Number of Visits 20    Date for PT Re-Evaluation 08/08/22    Authorization Type [facility] $25 copay    Progress Note Due on Visit 10    PT Start Time 0930    PT Stop Time 1010    PT Time Calculation (min) 40 min    Activity Tolerance Patient tolerated treatment well    Behavior During Therapy WFL for tasks assessed/performed                 Past Medical History:  Diagnosis Date   Anemia    thalacemia   Anxiety    Arthritis    Hypertension    Past Surgical History:  Procedure Laterality Date   ABDOMINAL HYSTERECTOMY     KNEE ARTHROSCOPY WITH MEDIAL MENISECTOMY Left 12/22/2020   Procedure: LEFT KNEE ARTHROSCOPY WITH PARTIAL MEDIAL MENISECTOMY;  Surgeon: Leandrew Koyanagi, MD;  Location: [place];  Service: Orthopedics;  Laterality: Left;   KNEE ARTHROSCOPY WITH MEDIAL MENISECTOMY Right 10/24/2021   Procedure: right knee arthroscopy partial medial and partial lateral meniscectomy and synovectomy;  Surgeon: Leandrew Koyanagi, MD;  Location: [place];  Service: Orthopedics;  Laterality: Right;   REDUCTION MAMMAPLASTY Bilateral    TOTAL KNEE ARTHROPLASTY Right 05/13/2022   Procedure: TOTAL KNEE ARTHROPLASTY;  Surgeon: Leandrew Koyanagi, MD;  Location: [place];  Service: Orthopedics;  Laterality: Right;   Patient Active Problem List   Diagnosis Date Noted   Primary osteoarthritis of right knee 05/13/2022   Status post total right knee replacement 05/13/2022   Acute medial meniscus tear of left knee 12/22/2020   Acute tear lateral meniscus, left, initial encounter 12/22/2020    PCP: Harlan Stains MD  REFERRING PROVIDER: Leandrew Koyanagi, MD  REFERRING DIAG: [phone] (ICD-10-CM) - Status post total right knee replacement  THERAPY  DIAG:  Chronic pain of right knee  Muscle weakness (generalized)  Difficulty in walking, not elsewhere classified  Localized edema  Rationale for Evaluation and Treatment Rehabilitation  ONSET DATE: 05/13/2022 surgery TKA  SUBJECTIVE:   SUBJECTIVE STATEMENT: Pt indicated having  complaints after last visit that resulted in cancelling visits last week.  Pt indicated tightness chief complaint.   PERTINENT HISTORY: History of Lt knee surgery  PAIN:  NPRS scale: before medication 3.5-4/10, after medication 2/10 Pain location: Rt knee Pain description: more of an ache today Aggravating factors: sleeping position being off/being in awkward position during sleep Relieving factors: pain medicine, icing  PRECAUTIONS: None  WEIGHT BEARING RESTRICTIONS: No  FALLS:  Has patient fallen in last 6 months? No  LIVING ENVIRONMENT: Lives in: House/apartment Stairs: 5 stairs to enter , rail on Rt side.    OCCUPATION: Full time at [employer] (standing, walking, sitting).  Walking for exercise.   PLOF: Independent, housework, have dog.   PATIENT GOALS: Reduce pain, walk and normal daily living without pain.    OBJECTIVE:    PATIENT SURVEYS:  05/30/2022 FOTO intake:  62  predicted:  66  EDEMA:  05/30/2022 Localized edema observed Rt knee/leg  POSTURE:  05/30/2022 Weight shift to Lt in standing.   PALPATION: 05/30/2022 Mild tenderness joint lines, peri  incision  LOWER EXTREMITY ROM:   ROM Right 05/30/2022 Left 05/30/2022  Rt 06/07/22 Right 06/11/22  Hip flexion      Hip extension      Hip abduction      Hip adduction      Hip internal rotation      Hip external rotation      Knee flexion 105 AROM in supine heel slide  Seated 110 AROM  A: 111 AA: 119  Knee extension -19 AROM in seated LAQ, heel prop PROM -10   Supine with heel prop/quad set 14 AROM A: -13 (seated LAQ) P: -4  Ankle dorsiflexion      Ankle plantarflexion      Ankle inversion      Ankle eversion        (Blank rows = not tested)  LOWER EXTREMITY MMT:  MMT Right 05/30/2022 Left 05/30/2022 Right   Hip flexion 5/5 5/5    Hip extension      Hip abduction      Hip adduction      Hip internal rotation      Hip external rotation      Knee flexion 4/5 5/5    Knee extension 2/5 5/5 5/5  39.5, 38.7 54.5, 47.5  Ankle dorsiflexion 5/5 5/5    Ankle plantarflexion      Ankle inversion      Ankle eversion       (Blank rows = not tested)  LOWER EXTREMITY SPECIAL TESTS:  05/30/2022 No specific testing  FUNCTIONAL TESTS:  05/30/2022 18 inch chair transfer: able to perform s UE assist but noted deviation to WB on Lt leg Lt SLS: 15 seconds Rt SLS: < 3 seconds  GAIT: 05/30/2022 Independent ambulation c antalgic gait, maintained knee flexion in stance on Rt, reduced stance on Rt, reduced step length on Lt.    TODAY'S TREATMENT  06/18/2022: Therex: Nustep lvl 5 11 mins UE/LE Leg press Rt leg only slow lowering focus 37 lbs x 21 x9 (no pain reported) Incline gastroc stretch bilateral 30 sec x 3 Step on over and down WB on Rt leg no UE assist 6 inch step x 10 Seated SLR Rt x 10   Neuro Re-ed Tandem stance 1 min x 1 bilateral Fitter rocker board fwd/back 20x quiet touching Tandem ambulation on foam fwd/back in // bars 6 ft x 5 each way    06/11/2022 TherEx SciFit bike seat 10 x 8 min; L2 Rt LAQ 2# 2x10; 3 sec hold Long sitting quad sets x 20 (performed bil with visual cues for improved quad activation) AA heel slides x 10 reps; Rt Leg Press bil push 75# 2x10; RLE only 31# 2x10 Knee extension machine 5# bil concentric, RLE only eccentric 2x10  06/07/2022  TherEx  Scifit bike seat 8 x6 minutes full rotations  Quad sets 1x15 3 second holds SAQs 2.5# x12 2 second holds Bridges x10  LAQs 2.5# 1x12 with 3 second holds   Manual  Knee extension and flexion OP  Retrograde massage LEs elevated  Tennis ball STM R quad with LEs elevated      PATIENT EDUCATION:   05/30/2022 Education details: HEP, POC Person educated: Patient Education method: Consulting civil engineer, Demonstration, Verbal cues, and Handouts Education comprehension: verbalized understanding, returned demonstration, and verbal cues required  HOME EXERCISE PROGRAM: Access Code: [email or web address] URL: [email or web address] Date: 05/30/2022 Prepared by: Scot Jun  Exercises - Seated Quad Set  - 3-5 x daily - 7 x weekly - 1 sets - 10 reps - 5 hold - Supine Heel Slide with Strap  - 3-5  x daily - 7 x weekly - 1 sets - 10 reps - 5 hold - Seated Long Arc Quad  - 3-5 x daily - 7 x weekly - 1-2 sets - 10 reps - 2 hold - Supine Knee Extension Mobilization with Weight  - 3-5 x daily - 7 x weekly - 1 sets - 1 reps - to tolerance up to 15 mins hold - Seated Passive Knee Extension (Mirrored)  - 3-5 x daily - 7 x weekly - 1 sets - 5 reps - to tolerance hold  ASSESSMENT:  CLINICAL IMPRESSION: Check of strength for extension showed improvement compared to eval but Rt leg limited compared to Lt in dynamometry assessment.  Reviewed importance of routine mobility in HEP to promote improved symptoms when stiffness is present.  No pain increases reported while in clinic today.   OBJECTIVE IMPAIRMENTS: Abnormal gait, decreased activity tolerance, decreased balance, decreased coordination, decreased endurance, decreased mobility, difficulty walking, decreased ROM, decreased strength, hypomobility, increased edema, increased fascial restrictions, impaired perceived functional ability, impaired flexibility, improper body mechanics, and pain.   ACTIVITY LIMITATIONS: carrying, lifting, bending, sitting, standing, squatting, sleeping, stairs, transfers, bed mobility, and locomotion level  PARTICIPATION LIMITATIONS: meal prep, cleaning, interpersonal relationship, shopping, community activity, occupation, and yard work  PERSONAL FACTORS:  HTN, arthritis  are also affecting patient's functional outcome.    REHAB POTENTIAL: Good  CLINICAL DECISION MAKING: Stable/uncomplicated  EVALUATION COMPLEXITY: Low   GOALS: Goals reviewed with patient? Yes  SHORT TERM GOALS: (target date for Short term goals are 3 weeks 06/20/2022)   1.  Patient will demonstrate independent use of home exercise program to maintain progress from in clinic treatments.  Goal status: Met   LONG TERM GOALS: (target dates for all long term goals are 10 weeks  08/08/2022 )   1. Patient will demonstrate/report pain at worst less than or equal to 2/10 to facilitate minimal limitation in daily activity secondary to pain symptoms.  Goal status: on going - 06/18/2022   2. Patient will demonstrate independent use of home exercise program to facilitate ability to maintain/progress functional gains from skilled physical therapy services.  Goal status: on going - 06/18/2022   3. Patient will demonstrate FOTO outcome > or = 68 % to indicate reduced disability due to condition.  Goal status: on going - 06/18/2022   4.  Patient will demonstrate Rt LE MMT 5/5 throughout to faciltiate usual transfers, stairs, squatting at [facility] for daily life.   Goal status: on going - 06/18/2022   5.  Patient will demonstrate Rt knee AROM 0-110 deg to facilitate usual mobility in transfers, ambulation and daily activity.  Goal status: on going - 06/18/2022   6.  Patient will demonstrate independent ambulation s deviation community distances > 300 ft.  Goal status: Met - 06/18/2022   7.  Patient will demonstrate ascending/descending stairs reciprocally without hand assist for household navigation.  Goal Status: on going - 06/18/2022   PLAN:  PT FREQUENCY: 2x/week  PT DURATION: 10 weeks  PLANNED INTERVENTIONS: Therapeutic exercises, Therapeutic activity, Neuro Muscular re-education, Balance training, Gait training, Patient/Family education, Joint mobilization, Stair training, DME instructions, Dry Needling, Electrical stimulation,  Traction, Cryotherapy, vasopneumatic deviceMoist heat, Taping, Ultrasound, Ionotophoresis 62m/ml Dexamethasone, and Manual therapy.  All included unless contraindicated  PLAN FOR NEXT SESSION: Compliant balance, quad strengthening.    MScot Jun PT, DPT, OCS, ATC 06/18/22  10:22 AM

## 2022-06-20 ENCOUNTER — Encounter: Payer: 59 | Admitting: Rehabilitative and Restorative Service Providers"

## 2022-06-21 ENCOUNTER — Other Ambulatory Visit: Payer: Self-pay | Admitting: Physician Assistant

## 2022-06-25 ENCOUNTER — Ambulatory Visit (INDEPENDENT_AMBULATORY_CARE_PROVIDER_SITE_OTHER): Payer: 59

## 2022-06-25 ENCOUNTER — Ambulatory Visit (INDEPENDENT_AMBULATORY_CARE_PROVIDER_SITE_OTHER): Payer: 59 | Admitting: Orthopaedic Surgery

## 2022-06-25 ENCOUNTER — Other Ambulatory Visit (HOSPITAL_COMMUNITY): Payer: Self-pay

## 2022-06-25 DIAGNOSIS — Z96651 Presence of right artificial knee joint: Secondary | ICD-10-CM | POA: Diagnosis not present

## 2022-06-25 MED ORDER — HYDROCODONE-ACETAMINOPHEN 5-325 MG PO TABS
1.0000 | ORAL_TABLET | Freq: Every day | ORAL | 0 refills | Status: DC | PRN
  Filled 2022-06-25: qty 20, 10d supply, fill #0

## 2022-06-25 NOTE — Progress Notes (Signed)
   Post-Op Visit Note   Patient: Heather Diaz           Date of Birth: 07/08/1961           MRN: [id number] Visit Date: 06/25/2022 PCP: Heather Montana, MD   Assessment & Plan:  Chief Complaint:  Chief Complaint  Patient presents with   Right Knee - Routine Post Op   Visit Diagnoses:  1. Status post total right knee replacement     Plan: Heather Diaz is now approximately 6 weeks status post right uncemented total knee replacement on 05/13/2022.  She does feel that she is improving overall.  She has some start up stiffness and discomfort but overall she is pleased.  She no longer has pain when she walks.  She is doing physical therapy once a week.  Examination of right knee shows a fully healed surgical scar no signs of infection.  She has excellent range of motion from 0 to 119 degrees.  Good varus valgus stability.  X-rays show stable implant without any complications.  I am very pleased with her progress in her recovery.  She will continue with outpatient PT.  She will continue to remain out of work for another 6 weeks.  Dental prophylaxis reinforced.  Recheck in 6 weeks.  Follow-Up Instructions: Return in about 6 weeks (around 08/06/2022).   Orders:  Orders Placed This Encounter  Procedures   XR Knee 1-2 Views Right   No orders of the defined types were placed in this encounter.   Imaging: No results found.  PMFS History: Patient Active Problem List   Diagnosis Date Noted   Primary osteoarthritis of right knee 05/13/2022   Status post total right knee replacement 05/13/2022   Acute medial meniscus tear of left knee 12/22/2020   Acute tear lateral meniscus, left, initial encounter 12/22/2020   Past Medical History:  Diagnosis Date   Anemia    thalacemia   Anxiety    Arthritis    Hypertension     No family history on file.  Past Surgical History:  Procedure Laterality Date   ABDOMINAL HYSTERECTOMY     KNEE ARTHROSCOPY WITH MEDIAL MENISECTOMY Left 12/22/2020    Procedure: LEFT KNEE ARTHROSCOPY WITH PARTIAL MEDIAL MENISECTOMY;  Surgeon: Tarry Kos, MD;  Location: [facility];  Service: Orthopedics;  Laterality: Left;   KNEE ARTHROSCOPY WITH MEDIAL MENISECTOMY Right 10/24/2021   Procedure: right knee arthroscopy partial medial and partial lateral meniscectomy and synovectomy;  Surgeon: Tarry Kos, MD;  Location: [facility];  Service: Orthopedics;  Laterality: Right;   REDUCTION MAMMAPLASTY Bilateral    TOTAL KNEE ARTHROPLASTY Right 05/13/2022   Procedure: TOTAL KNEE ARTHROPLASTY;  Surgeon: Tarry Kos, MD;  Location[facility];  Service: Orthopedics;  Laterality: Right;   Social History   Occupational History   Not on file  Tobacco Use   Smoking status: Never   Smokeless tobacco: Never  Vaping Use   Vaping Use: Never used  Substance and Sexual Activity   Alcohol use: Yes    Alcohol/week: 3.0 standard drinks of alcohol    Types: 3 Glasses of wine per week    Comment: maybe 1 glass of wine  2-3 times a week   Drug use: Never   Sexual activity: Not Currently    Birth control/protection: Surgical

## 2022-06-26 ENCOUNTER — Encounter: Payer: 59 | Admitting: Physical Therapy

## 2022-07-02 ENCOUNTER — Other Ambulatory Visit (HOSPITAL_COMMUNITY): Payer: Self-pay

## 2022-07-03 ENCOUNTER — Ambulatory Visit: Payer: 59 | Admitting: Rehabilitative and Restorative Service Providers"

## 2022-07-03 ENCOUNTER — Encounter: Payer: Self-pay | Admitting: Rehabilitative and Restorative Service Providers"

## 2022-07-03 DIAGNOSIS — R600 Localized edema: Secondary | ICD-10-CM

## 2022-07-03 DIAGNOSIS — G8929 Other chronic pain: Secondary | ICD-10-CM

## 2022-07-03 DIAGNOSIS — R262 Difficulty in walking, not elsewhere classified: Secondary | ICD-10-CM

## 2022-07-03 DIAGNOSIS — M6281 Muscle weakness (generalized): Secondary | ICD-10-CM

## 2022-07-03 DIAGNOSIS — M25561 Pain in right knee: Secondary | ICD-10-CM

## 2022-07-03 NOTE — Therapy (Signed)
OUTPATIENT PHYSICAL THERAPY TREATMENT   Patient Name: Heather Diaz MRN: [id number] DOB:[birth year], 61 y.o., female Today's Date: 07/03/2022  END OF SESSION:   PT End of Session - 07/03/22 0918     Visit Number 6    Number of Visits 20    Date for PT Re-Evaluation 08/08/22    Authorization Type [facility] $25 copay    Progress Note Due on Visit 10    PT Start Time 0930    PT Stop Time 1010    PT Time Calculation (min) 40 min    Activity Tolerance Patient tolerated treatment well    Behavior During Therapy WFL for tasks assessed/performed                  Past Medical History:  Diagnosis Date   Anemia    thalacemia   Anxiety    Arthritis    Hypertension    Past Surgical History:  Procedure Laterality Date   ABDOMINAL HYSTERECTOMY     KNEE ARTHROSCOPY WITH MEDIAL MENISECTOMY Left 12/22/2020   Procedure: LEFT KNEE ARTHROSCOPY WITH PARTIAL MEDIAL MENISECTOMY;  Surgeon: Leandrew Koyanagi, MD;  Location: [place];  Service: Orthopedics;  Laterality: Left;   KNEE ARTHROSCOPY WITH MEDIAL MENISECTOMY Right 10/24/2021   Procedure: right knee arthroscopy partial medial and partial lateral meniscectomy and synovectomy;  Surgeon: Leandrew Koyanagi, MD;  Location: [place];  Service: Orthopedics;  Laterality: Right;   REDUCTION MAMMAPLASTY Bilateral    TOTAL KNEE ARTHROPLASTY Right 05/13/2022   Procedure: TOTAL KNEE ARTHROPLASTY;  Surgeon: Leandrew Koyanagi, MD;  Location: [place];  Service: Orthopedics;  Laterality: Right;   Patient Active Problem List   Diagnosis Date Noted   Primary osteoarthritis of right knee 05/13/2022   Status post total right knee replacement 05/13/2022   Acute medial meniscus tear of left knee 12/22/2020   Acute tear lateral meniscus, left, initial encounter 12/22/2020    PCP: Harlan Stains MD  REFERRING PROVIDER: Leandrew Koyanagi, MD  REFERRING DIAG: [phone] (ICD-10-CM) - Status post total right knee replacement  THERAPY  DIAG:  Chronic pain of right knee  Muscle weakness (generalized)  Difficulty in walking, not elsewhere classified  Localized edema  Rationale for Evaluation and Treatment Rehabilitation  ONSET DATE: 05/13/2022 surgery TKA  SUBJECTIVE:   SUBJECTIVE STATEMENT: Pt indicated having tenderness around incision.  Pt indicated 4/10 at worst since last visit.    PERTINENT HISTORY: History of Lt knee surgery  PAIN:  NPRS scale: pain at worst since last visit 4/10 Pain location: Rt knee Pain description: more of an ache today Aggravating factors: sleeping position being off/being in awkward position during sleep Relieving factors: pain medicine, icing  PRECAUTIONS: None  WEIGHT BEARING RESTRICTIONS: No  FALLS:  Has patient fallen in last 6 months? No  LIVING ENVIRONMENT: Lives in: House/apartment Stairs: 5 stairs to enter , rail on Rt side.    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  Walking for exercise.   PLOF: Independent, housework, have dog.   PATIENT GOALS: Reduce pain, walk and normal daily living without pain.    OBJECTIVE:    PATIENT SURVEYS:  05/30/2022 FOTO intake:  62  predicted:  68  EDEMA:  05/30/2022 Localized edema observed Rt knee/leg  POSTURE:  05/30/2022 Weight shift to Lt in standing.   PALPATION: 05/30/2022 Mild tenderness joint lines, peri  incision  LOWER EXTREMITY ROM:   ROM Right 05/30/2022 Left 05/30/2022 Rt 06/07/22 Right  06/11/22 Right 07/03/2022  Hip flexion       Hip extension       Hip abduction       Hip adduction       Hip internal rotation       Hip external rotation       Knee flexion 105 AROM in supine heel slide  Seated 110 AROM  A: 111 AA: 119   Knee extension -19 AROM in seated LAQ, heel prop PROM -10   Supine with heel prop/quad set 14 AROM A: -13 (seated LAQ) P: -4 -10 AROM in seated LAQ  -5 in supine quad set  Ankle dorsiflexion       Ankle plantarflexion       Ankle inversion        Ankle eversion        (Blank rows = not tested)  LOWER EXTREMITY MMT:  MMT Right 05/30/2022 Left 05/30/2022 Right 06/18/2022 Left 06/18/2022  Hip flexion 5/5 5/5    Hip extension      Hip abduction      Hip adduction      Hip internal rotation      Hip external rotation      Knee flexion 4/5 5/5    Knee extension 2/5 5/5 5/5  39.5, 38.7 lbs 54.5, 47.5 lbs  Ankle dorsiflexion 5/5 5/5    Ankle plantarflexion      Ankle inversion      Ankle eversion       (Blank rows = not tested)  LOWER EXTREMITY SPECIAL TESTS:  05/30/2022 No specific testing  FUNCTIONAL TESTS:  05/30/2022 18 inch chair transfer: able to perform s UE assist but noted deviation to WB on Lt leg Lt SLS: 15 seconds Rt SLS: < 3 seconds  GAIT: 05/30/2022 Independent ambulation c antalgic gait, maintained knee flexion in stance on Rt, reduced stance on Rt, reduced step length on Lt.    TODAY'S TREATMENT  07/03/2022: Therex: Nustep lvl 6 10 mins UE/LE Incline gastroc stretch bilateral 30 sec x 3 Slow lateral step down focus WB on Rt 2 x 10 4 inch step Seated heel prop in chair 5 mins (cues for home use) Seated Rt leg LAQ x 10    Neuro Re-ed SLS on black mat c contralateral leg touching to corners x 8 each bilateral Tandem ambulation on foam 1 min x 1 bilateral  Manual Incision cross friction massage (c education for home)  06/18/2022: Therex: Nustep lvl 5 11 mins UE/LE Leg press Rt leg only slow lowering focus 37 lbs x 21 x9 (no pain reported) Incline gastroc stretch bilateral 30 sec x 3 Step on over and down WB on Rt leg no UE assist 6 inch step x 10 Seated SLR Rt x 10   Neuro Re-ed Tandem stance 1 min x 1 bilateral Fitter rocker board fwd/back 20x quiet touching Tandem ambulation on foam fwd/back in // bars 6 ft x 5 each way    06/11/2022 TherEx SciFit bike seat 10 x 8 min; L2 Rt LAQ 2# 2x10; 3 sec hold Long sitting quad sets x 20 (performed bil with visual cues for improved  quad activation) AA heel slides x 10 reps; Rt Leg Press bil push 75# 2x10; RLE only 31# 2x10 Knee extension machine 5# bil concentric, RLE only eccentric 2x10  PATIENT EDUCATION:  05/30/2022 Education details: HEP, POC Person educated: Patient Education method: Consulting civil engineer, Demonstration, Verbal cues, and Handouts Education comprehension: verbalized understanding, returned demonstration, and verbal cues required  HOME EXERCISE PROGRAM: Access Code: [email or web address] URL: [email or web address] Date: 05/30/2022 Prepared by: Scot Jun  Exercises - Seated Quad Set  - 3-5 x daily - 7 x weekly - 1 sets - 10 reps - 5 hold - Supine Heel Slide with Strap  - 3-5 x daily - 7 x weekly - 1 sets - 10 reps - 5 hold - Seated Long Arc Quad  - 3-5 x daily - 7 x weekly - 1-2 sets - 10 reps - 2 hold - Supine Knee Extension Mobilization with Weight  - 3-5 x daily - 7 x weekly - 1 sets - 1 reps - to tolerance up to 15 mins hold - Seated Passive Knee Extension (Mirrored)  - 3-5 x daily - 7 x weekly - 1 sets - 5 reps - to tolerance hold  ASSESSMENT:  CLINICAL IMPRESSION: Pt to benefit from continued efforts in clinic and at home for extension mobility gains and strength to improve functional movement patterns in walking and other WB activity.  Education and trial of cross friction incision massage for use at home.   OBJECTIVE IMPAIRMENTS: Abnormal gait, decreased activity tolerance, decreased balance, decreased coordination, decreased endurance, decreased mobility, difficulty walking, decreased ROM, decreased strength, hypomobility, increased edema, increased fascial restrictions, impaired perceived functional ability, impaired flexibility, improper body mechanics, and pain.   ACTIVITY LIMITATIONS: carrying, lifting, bending, sitting, standing, squatting, sleeping, stairs, transfers, bed mobility, and locomotion level  PARTICIPATION LIMITATIONS: meal prep, cleaning, interpersonal relationship,  shopping, community activity, occupation, and yard work  PERSONAL FACTORS:  HTN, arthritis  are also affecting patient's functional outcome.   REHAB POTENTIAL: Good  CLINICAL DECISION MAKING: Stable/uncomplicated  EVALUATION COMPLEXITY: Low   GOALS: Goals reviewed with patient? Yes  SHORT TERM GOALS: (target date for Short term goals are 3 weeks 06/20/2022)   1.  Patient will demonstrate independent use of home exercise program to maintain progress from in clinic treatments.  Goal status: Met   LONG TERM GOALS: (target dates for all long term goals are 10 weeks  08/08/2022 )   1. Patient will demonstrate/report pain at worst less than or equal to 2/10 to facilitate minimal limitation in daily activity secondary to pain symptoms.  Goal status: on going - 06/18/2022   2. Patient will demonstrate independent use of home exercise program to facilitate ability to maintain/progress functional gains from skilled physical therapy services.  Goal status: on going - 06/18/2022   3. Patient will demonstrate FOTO outcome > or = 68 % to indicate reduced disability due to condition.  Goal status: on going - 06/18/2022   4.  Patient will demonstrate Rt LE MMT 5/5 throughout to faciltiate usual transfers, stairs, squatting at [facility] for daily life.   Goal status: on going - 06/18/2022   5.  Patient will demonstrate Rt knee AROM 0-110 deg to facilitate usual mobility in transfers, ambulation and daily activity.  Goal status: on going - 06/18/2022   6.  Patient will demonstrate independent ambulation s deviation community distances > 300 ft.  Goal status: Met - 06/18/2022   7.  Patient will demonstrate ascending/descending stairs reciprocally without hand assist for household navigation.  Goal Status: on going - 06/18/2022   PLAN:  PT FREQUENCY: 2x/week  PT DURATION: 10 weeks  PLANNED INTERVENTIONS: Therapeutic exercises, Therapeutic activity, Neuro Muscular re-education, Balance  training, Gait training, Patient/Family education, Joint mobilization, Stair training, DME instructions, Dry Needling, Electrical stimulation, Traction, Cryotherapy, vasopneumatic deviceMoist heat, Taping, Ultrasound, Ionotophoresis 67m/ml Dexamethasone, and Manual therapy.  All included unless contraindicated  PLAN FOR NEXT SESSION: Continue extension gains, quad strengthening.  Check dynamometry and FOTO.    Scot Jun, PT, DPT, OCS, ATC 07/03/22  10:13 AM

## 2022-07-10 ENCOUNTER — Encounter: Payer: 59 | Admitting: Rehabilitative and Restorative Service Providers"

## 2022-07-11 ENCOUNTER — Other Ambulatory Visit (HOSPITAL_COMMUNITY): Payer: Self-pay

## 2022-07-16 ENCOUNTER — Encounter: Payer: 59 | Admitting: Orthopaedic Surgery

## 2022-07-17 ENCOUNTER — Encounter: Payer: Self-pay | Admitting: Physical Therapy

## 2022-07-17 ENCOUNTER — Ambulatory Visit: Payer: 59 | Admitting: Physical Therapy

## 2022-07-17 DIAGNOSIS — M25561 Pain in right knee: Secondary | ICD-10-CM

## 2022-07-17 DIAGNOSIS — G8929 Other chronic pain: Secondary | ICD-10-CM | POA: Diagnosis not present

## 2022-07-17 DIAGNOSIS — M6281 Muscle weakness (generalized): Secondary | ICD-10-CM | POA: Diagnosis not present

## 2022-07-17 DIAGNOSIS — R262 Difficulty in walking, not elsewhere classified: Secondary | ICD-10-CM | POA: Diagnosis not present

## 2022-07-17 DIAGNOSIS — R600 Localized edema: Secondary | ICD-10-CM | POA: Diagnosis not present

## 2022-07-17 NOTE — Therapy (Signed)
OUTPATIENT PHYSICAL THERAPY TREATMENT DISCHARGE SUMMARY   Patient Name: Heather Diaz MRN: [id number] DOB:Jun 29, 1961, 61 y.o., female Today's Date: 07/17/2022  END OF SESSION:   PT End of Session - 07/17/22 1012     Visit Number 7    Number of Visits 20    Date for PT Re-Evaluation 08/08/22    Authorization Type [facility] $25 copay    Progress Note Due on Visit 10    PT Start Time 1012    PT Stop Time 1050    PT Time Calculation (min) 38 min    Activity Tolerance Patient tolerated treatment well    Behavior During Therapy WFL for tasks assessed/performed                   Past Medical History:  Diagnosis Date   Anemia    thalacemia   Anxiety    Arthritis    Hypertension    Past Surgical History:  Procedure Laterality Date   ABDOMINAL HYSTERECTOMY     KNEE ARTHROSCOPY WITH MEDIAL MENISECTOMY Left 12/22/2020   Procedure: LEFT KNEE ARTHROSCOPY WITH PARTIAL MEDIAL MENISECTOMY;  Surgeon: Leandrew Koyanagi, MD;  Location: [place];  Service: Orthopedics;  Laterality: Left;   KNEE ARTHROSCOPY WITH MEDIAL MENISECTOMY Right 10/24/2021   Procedure: right knee arthroscopy partial medial and partial lateral meniscectomy and synovectomy;  Surgeon: Leandrew Koyanagi, MD;  Location: [place];  Service: Orthopedics;  Laterality: Right;   REDUCTION MAMMAPLASTY Bilateral    TOTAL KNEE ARTHROPLASTY Right 05/13/2022   Procedure: TOTAL KNEE ARTHROPLASTY;  Surgeon: Leandrew Koyanagi, MD;  Location: [place];  Service: Orthopedics;  Laterality: Right;   Patient Active Problem List   Diagnosis Date Noted   Primary osteoarthritis of right knee 05/13/2022   Status post total right knee replacement 05/13/2022   Acute medial meniscus tear of left knee 12/22/2020   Acute tear lateral meniscus, left, initial encounter 12/22/2020    PCP: Harlan Stains MD  REFERRING PROVIDER: Leandrew Koyanagi, MD  REFERRING DIAG: [id number] (ICD-10-CM) - Status post total right knee  replacement  THERAPY DIAG:  Chronic pain of right knee  Muscle weakness (generalized)  Difficulty in walking, not elsewhere classified  Localized edema  Rationale for Evaluation and Treatment Rehabilitation  ONSET DATE: 05/13/2022 surgery TKA  SUBJECTIVE:   SUBJECTIVE STATEMENT: Goes back to work 08/07/22; negotiating stairs without difficulty.  Reports knee is doing well, incision is just a little tender.  PERTINENT HISTORY: History of Lt knee surgery  PAIN:  NPRS scale: none in knee Pain location: Rt knee Pain description: more of an ache today Aggravating factors: sleeping position being off/being in awkward position during sleep Relieving factors: pain medicine, icing  PRECAUTIONS: None  WEIGHT BEARING RESTRICTIONS: No  FALLS:  Has patient fallen in last 6 months? No  LIVING ENVIRONMENT: Lives in: House/apartment Stairs: 5 stairs to enter , rail on Rt side.    OCCUPATION: Full time at [employer] (standing, walking, sitting).  Walking for exercise.   PLOF: Independent, housework, have dog.   PATIENT GOALS: Reduce pain, walk and normal daily living without pain.    OBJECTIVE:    PATIENT SURVEYS:  07/17/22: FOTO 78  05/30/2022 FOTO intake:  64  predicted:  68  EDEMA:  05/30/2022 Localized edema observed Rt knee/leg  POSTURE:  05/30/2022 Weight shift to Lt in standing.   PALPATION: 05/30/2022 Mild tenderness joint lines, peri  incision  LOWER EXTREMITY ROM:   ROM  Right 05/30/2022 Left 05/30/2022 Rt 06/07/22 Right 06/11/22 Right 07/03/2022 Right 07/17/22  Hip flexion        Hip extension        Hip abduction        Hip adduction        Hip internal rotation        Hip external rotation        Knee flexion 105 AROM in supine heel slide  Seated 110 AROM  A: 111 AA: 119  A: 115  Knee extension -19 AROM in seated LAQ, heel prop PROM -10   Supine with heel prop/quad set 14 AROM A: -13 (seated LAQ) P: -4 -10 AROM in seated LAQ  -5 in supine  quad set -5 (seated LAQ)  Ankle dorsiflexion        Ankle plantarflexion        Ankle inversion        Ankle eversion         (Blank rows = not tested)  LOWER EXTREMITY MMT:  MMT Right 05/30/2022 Left 05/30/2022 Right 06/18/2022 Left 06/18/2022 Right 07/17/22  Hip flexion 5/5 5/5     Hip extension       Hip abduction       Hip adduction       Hip internal rotation       Hip external rotation       Knee flexion 4/5 5/5     Knee extension 2/5 5/5 5/5  39.5, 38.7 lbs 54.5, 47.5 lbs 57.1 #  Ankle dorsiflexion 5/5 5/5     Ankle plantarflexion       Ankle inversion       Ankle eversion        (Blank rows = not tested)  LOWER EXTREMITY SPECIAL TESTS:  05/30/2022 No specific testing  FUNCTIONAL TESTS:  05/30/2022 18 inch chair transfer: able to perform s UE assist but noted deviation to WB on Lt leg Lt SLS: 15 seconds Rt SLS: < 3 seconds  GAIT: 05/30/2022 Independent ambulation c antalgic gait, maintained knee flexion in stance on Rt, reduced stance on Rt, reduced step length on Lt.    TODAY'S TREATMENT  07/17/22 Therex: Nustep lvl 6 10 mins LE ROM and strength testing - see above Negotiated stairs independently without rail, reciprocal gait Discussed exercises and progression - encouraged daily exercise, pt has home gym  07/03/2022: Therex: Nustep lvl 6 10 mins UE/LE Incline gastroc stretch bilateral 30 sec x 3 Slow lateral step down focus WB on Rt 2 x 10 4 inch step Seated heel prop in chair 5 mins (cues for home use) Seated Rt leg LAQ x 10    Neuro Re-ed SLS on black mat c contralateral leg touching to corners x 8 each bilateral Tandem ambulation on foam 1 min x 1 bilateral  Manual Incision cross friction massage (c education for home)  06/18/2022: Therex: Nustep lvl 5 11 mins UE/LE Leg press Rt leg only slow lowering focus 37 lbs x 21 x9 (no pain reported) Incline gastroc stretch bilateral 30 sec x 3 Step on over and down WB on Rt leg no UE  assist 6 inch step x 10 Seated SLR Rt x 10   Neuro Re-ed Tandem stance 1 min x 1 bilateral Fitter rocker board fwd/back 20x quiet touching Tandem ambulation on foam fwd/back in // bars 6 ft x 5 each way    06/11/2022 TherEx SciFit bike seat 10 x 8 min; L2 Rt LAQ 2# 2x10; 3  sec hold Long sitting quad sets x 20 (performed bil with visual cues for improved quad activation) AA heel slides x 10 reps; Rt Leg Press bil push 75# 2x10; RLE only 31# 2x10 Knee extension machine 5# bil concentric, RLE only eccentric 2x10  PATIENT EDUCATION:  05/30/2022 Education details: HEP, POC Person educated: Patient Education method: Consulting civil engineer, Media planner, Verbal cues, and Handouts Education comprehension: verbalized understanding, returned demonstration, and verbal cues required  HOME EXERCISE PROGRAM: Access Code: [email or web address] URL: [email or web address] Date: 05/30/2022 Prepared by: Scot Jun  Exercises - Seated Quad Set  - 3-5 x daily - 7 x weekly - 1 sets - 10 reps - 5 hold - Supine Heel Slide with Strap  - 3-5 x daily - 7 x weekly - 1 sets - 10 reps - 5 hold - Seated Long Arc Quad  - 3-5 x daily - 7 x weekly - 1-2 sets - 10 reps - 2 hold - Supine Knee Extension Mobilization with Weight  - 3-5 x daily - 7 x weekly - 1 sets - 1 reps - to tolerance up to 15 mins hold - Seated Passive Knee Extension (Mirrored)  - 3-5 x daily - 7 x weekly - 1 sets - 5 reps - to tolerance hold  ASSESSMENT:  CLINICAL IMPRESSION: Pt is pleased with current progress and has met nearly all goals.  Recommend d/c at this time, she knows she can get a new referral if needed to return.  Will d/c PT today  OBJECTIVE IMPAIRMENTS: Abnormal gait, decreased activity tolerance, decreased balance, decreased coordination, decreased endurance, decreased mobility, difficulty walking, decreased ROM, decreased strength, hypomobility, increased edema, increased fascial restrictions, impaired perceived functional  ability, impaired flexibility, improper body mechanics, and pain.   ACTIVITY LIMITATIONS: carrying, lifting, bending, sitting, standing, squatting, sleeping, stairs, transfers, bed mobility, and locomotion level  PARTICIPATION LIMITATIONS: meal prep, cleaning, interpersonal relationship, shopping, community activity, occupation, and yard work  PERSONAL FACTORS:  HTN, arthritis  are also affecting patient's functional outcome.   REHAB POTENTIAL: Good  CLINICAL DECISION MAKING: Stable/uncomplicated  EVALUATION COMPLEXITY: Low   GOALS: Goals reviewed with patient? Yes  SHORT TERM GOALS: (target date for Short term goals are 3 weeks 06/20/2022)   1.  Patient will demonstrate independent use of home exercise program to maintain progress from in clinic treatments.  Goal status: Met   LONG TERM GOALS: (target dates for all long term goals are 10 weeks  08/08/2022 )   1. Patient will demonstrate/report pain at worst less than or equal to 2/10 to facilitate minimal limitation in daily activity secondary to pain symptoms.  Goal status: MET 07/17/22   2. Patient will demonstrate independent use of home exercise program to facilitate ability to maintain/progress functional gains from skilled physical therapy services.  Goal status: MET 07/17/22   3. Patient will demonstrate FOTO outcome > or = 68 % to indicate reduced disability due to condition.  Goal status: MET 07/17/22   4.  Patient will demonstrate Rt LE MMT 5/5 throughout to faciltiate usual transfers, stairs, squatting at [facility] for daily life.   Goal status: MET 07/17/22   5.  Patient will demonstrate Rt knee AROM 0-110 deg to facilitate usual mobility in transfers, ambulation and daily activity.  Goal status: Partially Met 07/17/22   6.  Patient will demonstrate independent ambulation s deviation community distances > 300 ft.  Goal status: Met - 06/18/2022   7.  Patient will demonstrate ascending/descending stairs  reciprocally without hand assist for household navigation.  Goal Status: MET 07/17/22   PLAN:  PT FREQUENCY: 2x/week  PT DURATION: 10 weeks  PLANNED INTERVENTIONS: Therapeutic exercises, Therapeutic activity, Neuro Muscular re-education, Balance training, Gait training, Patient/Family education, Joint mobilization, Stair training, DME instructions, Dry Needling, Electrical stimulation, Traction, Cryotherapy, vasopneumatic deviceMoist heat, Taping, Ultrasound, Ionotophoresis 37m/ml Dexamethasone, and Manual therapy.  All included unless contraindicated  PLAN FOR NEXT SESSION: d/c PT today  SLaureen Abrahams PT, DPT 07/17/22 11:10 AM     PHYSICAL THERAPY DISCHARGE SUMMARY  Visits from Start of Care: 7  Current functional level related to goals / functional outcomes: See above   Remaining deficits: See above   Education / Equipment: HEP   Patient agrees to discharge. Patient goals were partially met. Patient is being discharged due to being pleased with the current functional level.  SLaureen Abrahams PT, DPT 07/17/22 11:12 AM  [facility] [address]reep[place] [address]Phone: [phone]  Fax:  [phone]

## 2022-07-19 ENCOUNTER — Other Ambulatory Visit: Payer: Self-pay | Admitting: Physician Assistant

## 2022-07-19 ENCOUNTER — Other Ambulatory Visit: Payer: Self-pay | Admitting: Orthopaedic Surgery

## 2022-07-31 ENCOUNTER — Other Ambulatory Visit (HOSPITAL_COMMUNITY): Payer: Self-pay

## 2022-07-31 ENCOUNTER — Ambulatory Visit (INDEPENDENT_AMBULATORY_CARE_PROVIDER_SITE_OTHER): Payer: 59 | Admitting: Orthopaedic Surgery

## 2022-07-31 ENCOUNTER — Encounter: Payer: Self-pay | Admitting: Orthopaedic Surgery

## 2022-07-31 DIAGNOSIS — Z96651 Presence of right artificial knee joint: Secondary | ICD-10-CM

## 2022-07-31 MED ORDER — DICLOFENAC SODIUM 75 MG PO TBEC
75.0000 mg | DELAYED_RELEASE_TABLET | Freq: Two times a day (BID) | ORAL | 2 refills | Status: DC | PRN
  Filled 2022-07-31: qty 60, 30d supply, fill #0

## 2022-07-31 MED ORDER — HYDROCODONE-ACETAMINOPHEN 5-325 MG PO TABS
1.0000 | ORAL_TABLET | Freq: Every day | ORAL | 0 refills | Status: DC | PRN
  Filled 2022-07-31: qty 20, 20d supply, fill #0

## 2022-07-31 NOTE — Progress Notes (Signed)
Post-Op Visit Note   Patient: Heather Diaz           Date of Birth: Nov 21, 1960           MRN: [id number] Visit Date: 07/31/2022 PCP: Laurann Montana, MD   Assessment & Plan:  Chief Complaint:  Chief Complaint  Patient presents with   Right Knee - Follow-up    Right total knee arthroplasty 05/13/2022   Visit Diagnoses:  1. Status post total right knee replacement     Plan: Patient is a pleasant 61 year old female comes in today 3 months status post Right total knee replacement, date of surgery 05/13/2022.  She has been doing well.  She finished physical therapy last week.  Examination of her right knee reveals a well-healed surgical incision but has a large keloid.  Range of motion 0 to 120 degrees.  Stable valgus varus stress.  She is neurovascular intact distally.  At this point, she will continue to advance with activity as tolerated.  I recommended massaging her scar with Mederma or vitamin E oil.  Dental prophylaxis reinforced.  She will follow-up in 3 months for repeat evaluation and 2 view x-rays of the right knee.  Call with concerns or questions. Follow-Up Instructions: Return in about 3 months (around 10/30/2022).   Orders:  No orders of the defined types were placed in this encounter.  Meds ordered this encounter  Medications   HYDROcodone-acetaminophen (NORCO) 5-325 MG tablet    Sig: Take 1 tablet by mouth daily as needed.    Dispense:  20 tablet    Refill:  0   diclofenac (VOLTAREN) 75 MG EC tablet    Sig: Take 1 tablet (75 mg total) by mouth 2 (two) times daily as needed.    Dispense:  60 tablet    Refill:  2    Imaging: No new imaging  PMFS History: Patient Active Problem List   Diagnosis Date Noted   Primary osteoarthritis of right knee 05/13/2022   Status post total right knee replacement 05/13/2022   Acute medial meniscus tear of left knee 12/22/2020   Acute tear lateral meniscus, left, initial encounter 12/22/2020   Past Medical History:   Diagnosis Date   Anemia    thalacemia   Anxiety    Arthritis    Hypertension     No family history on file.  Past Surgical History:  Procedure Laterality Date   ABDOMINAL HYSTERECTOMY     KNEE ARTHROSCOPY WITH MEDIAL MENISECTOMY Left 12/22/2020   Procedure: LEFT KNEE ARTHROSCOPY WITH PARTIAL MEDIAL MENISECTOMY;  Surgeon: Tarry Kos, MD;  Location: [facility];  Service: Orthopedics;  Laterality: Left;   KNEE ARTHROSCOPY WITH MEDIAL MENISECTOMY Right 10/24/2021   Procedure: right knee arthroscopy partial medial and partial lateral meniscectomy and synovectomy;  Surgeon: Tarry Kos, MD;  Location: [facility];  Service: Orthopedics;  Laterality: Right;   REDUCTION MAMMAPLASTY Bilateral    TOTAL KNEE ARTHROPLASTY Right 05/13/2022   Procedure: TOTAL KNEE ARTHROPLASTY;  Surgeon: Tarry Kos, MD;  Location: [facility];  Service: Orthopedics;  Laterality: Right;   Social History   Occupational History   Not on file  Tobacco Use   Smoking status: Never   Smokeless tobacco: Never  Vaping Use   Vaping Use: Never used  Substance and Sexual Activity   Alcohol use: Yes    Alcohol/week: 3.0 standard drinks of alcohol    Types: 3 Glasses of wine per week    Comment:  maybe 1 glass of wine  2-3 times a week   Drug use: Never   Sexual activity: Not Currently    Birth control/protection: Surgical

## 2022-08-04 DIAGNOSIS — Z760 Encounter for issue of repeat prescription: Secondary | ICD-10-CM | POA: Diagnosis not present

## 2022-08-07 ENCOUNTER — Encounter: Payer: Self-pay | Admitting: Orthopaedic Surgery

## 2022-09-09 ENCOUNTER — Other Ambulatory Visit (HOSPITAL_COMMUNITY): Payer: Self-pay

## 2022-09-09 MED ORDER — ZOLPIDEM TARTRATE 10 MG PO TABS
5.0000 mg | ORAL_TABLET | Freq: Every evening | ORAL | 0 refills | Status: DC | PRN
  Filled 2022-09-09: qty 90, 90d supply, fill #0

## 2022-11-05 ENCOUNTER — Other Ambulatory Visit (INDEPENDENT_AMBULATORY_CARE_PROVIDER_SITE_OTHER): Payer: Commercial Managed Care - PPO

## 2022-11-05 ENCOUNTER — Ambulatory Visit: Payer: Commercial Managed Care - PPO | Admitting: Orthopaedic Surgery

## 2022-11-05 ENCOUNTER — Encounter: Payer: Self-pay | Admitting: Orthopaedic Surgery

## 2022-11-05 DIAGNOSIS — Z96651 Presence of right artificial knee joint: Secondary | ICD-10-CM | POA: Diagnosis not present

## 2022-11-05 NOTE — Progress Notes (Signed)
Office Visit Note   Patient: Heather Diaz           Date of Birth: Jun 13, 1961           MRN: [id number] Visit Date: 11/05/2022              Requested by: Harlan Stains, MD [place],  [place] [id number] PCP: Harlan Stains, MD   Assessment & Plan: Visit Diagnoses:  1. Status post total right knee replacement     Plan: Overall Heather Diaz is very happy with her outcome.  She reports minimal pain symptoms.  Dental prophylaxis reinforced.  Recheck in 6 months with two-view x-rays of the right knee.  Follow-Up Instructions: Return in about 6 months (around 05/07/2023).   Orders:  Orders Placed This Encounter  Procedures   XR Knee 1-2 Views Right   No orders of the defined types were placed in this encounter.     Procedures: No procedures performed   Clinical Data: No additional findings.   Subjective: Chief Complaint  Patient presents with   Right Knee - Follow-up    Right total knee arthroplasty 05/13/2022    HPI  Heather Diaz is 6 months status post right total knee replacement.  She feels that she is 95% recovered.  She is very happy overall.  Review of Systems   Objective: Vital Signs: There were no vitals taken for this visit.  Physical Exam  Ortho Exam  [name] of right knee shows a fully healed surgical scar.  She does have a prominent keloid.  Range of motion is excellent.  Good varus valgus stability.  Specialty Comments:  No specialty comments available.  Imaging: XR Knee 1-2 Views Right  Result Date: 11/05/2022 Stable right total knee replacement in good alignment     PMFS History: Patient Active Problem List   Diagnosis Date Noted   Primary osteoarthritis of right knee 05/13/2022   Status post total right knee replacement 05/13/2022   Acute medial meniscus tear of left knee 12/22/2020   Acute tear lateral meniscus, left, initial encounter 12/22/2020   Past Medical History:  Diagnosis Date   Anemia    thalacemia    Anxiety    Arthritis    Hypertension     No family history on file.  Past Surgical History:  Procedure Laterality Date   ABDOMINAL HYSTERECTOMY     KNEE ARTHROSCOPY WITH MEDIAL MENISECTOMY Left 12/22/2020   Procedure: LEFT KNEE ARTHROSCOPY WITH PARTIAL MEDIAL MENISECTOMY;  Surgeon: Leandrew Koyanagi, MD;  Location: [place];  Service: Orthopedics;  Laterality: Left;   KNEE ARTHROSCOPY WITH MEDIAL MENISECTOMY Right 10/24/2021   Procedure: right knee arthroscopy partial medial and partial lateral meniscectomy and synovectomy;  Surgeon: Leandrew Koyanagi, MD;  Location: [place];  Service: Orthopedics;  Laterality: Right;   REDUCTION MAMMAPLASTY Bilateral    TOTAL KNEE ARTHROPLASTY Right 05/13/2022   Procedure: TOTAL KNEE ARTHROPLASTY;  Surgeon: Leandrew Koyanagi, MD;  Location: [place];  Service: Orthopedics;  Laterality: Right;   Social History   Occupational History   Not on file  Tobacco Use   Smoking status: Never   Smokeless tobacco: Never  Vaping Use   Vaping Use: Never used  Substance and Sexual Activity   Alcohol use: Yes    Alcohol/week: 3.0 standard drinks of alcohol    Types: 3 Glasses of wine per week    Comment: maybe 1 glass of wine  2-3 times a week  Drug use: Never   Sexual activity: Not Currently    Birth control/protection: Surgical

## 2022-12-04 ENCOUNTER — Other Ambulatory Visit (HOSPITAL_COMMUNITY): Payer: Self-pay

## 2022-12-06 ENCOUNTER — Other Ambulatory Visit (HOSPITAL_COMMUNITY): Payer: Self-pay

## 2022-12-06 MED ORDER — ZOLPIDEM TARTRATE 10 MG PO TABS
5.0000 mg | ORAL_TABLET | Freq: Every evening | ORAL | 0 refills | Status: DC | PRN
  Filled 2022-12-06 (×3): qty 90, 90d supply, fill #0

## 2022-12-26 ENCOUNTER — Ambulatory Visit
Admission: RE | Admit: 2022-12-26 | Discharge: 2022-12-26 | Disposition: A | Discharge status: Home / Self Care | Payer: Commercial Managed Care - PPO | Source: Ambulatory Visit | Attending: Family Medicine | Admitting: Family Medicine

## 2022-12-26 ENCOUNTER — Other Ambulatory Visit: Payer: Self-pay | Admitting: Family Medicine

## 2022-12-26 DIAGNOSIS — Z1231 Encounter for screening mammogram for malignant neoplasm of breast: Secondary | ICD-10-CM | POA: Diagnosis not present

## 2023-01-04 ENCOUNTER — Encounter: Payer: Self-pay | Admitting: Orthopaedic Surgery

## 2023-01-09 ENCOUNTER — Other Ambulatory Visit (HOSPITAL_COMMUNITY): Payer: Self-pay

## 2023-01-09 DIAGNOSIS — D569 Thalassemia, unspecified: Secondary | ICD-10-CM | POA: Diagnosis not present

## 2023-01-09 DIAGNOSIS — I10 Essential (primary) hypertension: Secondary | ICD-10-CM | POA: Diagnosis not present

## 2023-01-09 DIAGNOSIS — E669 Obesity, unspecified: Secondary | ICD-10-CM | POA: Diagnosis not present

## 2023-01-09 DIAGNOSIS — G4700 Insomnia, unspecified: Secondary | ICD-10-CM | POA: Diagnosis not present

## 2023-01-09 DIAGNOSIS — F419 Anxiety disorder, unspecified: Secondary | ICD-10-CM | POA: Diagnosis not present

## 2023-01-09 MED ORDER — TRIAMTERENE-HCTZ 75-50 MG PO TABS
1.0000 | ORAL_TABLET | Freq: Every morning | ORAL | 1 refills | Status: DC
  Filled 2023-01-09: qty 90, 90d supply, fill #0
  Filled 2023-04-14: qty 90, 90d supply, fill #1

## 2023-01-09 MED ORDER — ZOLPIDEM TARTRATE 10 MG PO TABS
5.0000 mg | ORAL_TABLET | Freq: Every evening | ORAL | 1 refills | Status: DC | PRN
  Filled 2023-01-09 – 2023-03-10 (×2): qty 90, 90d supply, fill #0
  Filled 2023-06-11: qty 90, 90d supply, fill #1

## 2023-01-09 MED ORDER — ALPRAZOLAM 0.5 MG PO TABS
0.5000 mg | ORAL_TABLET | Freq: Every day | ORAL | 2 refills | Status: DC | PRN
  Filled 2023-01-09: qty 30, 30d supply, fill #0
  Filled 2023-03-10: qty 30, 30d supply, fill #1
  Filled 2023-04-14: qty 30, 30d supply, fill #2

## 2023-01-09 MED ORDER — METOPROLOL SUCCINATE ER 200 MG PO TB24
200.0000 mg | ORAL_TABLET | Freq: Every day | ORAL | 1 refills | Status: DC
  Filled 2023-01-09: qty 90, 90d supply, fill #0
  Filled 2023-04-14: qty 90, 90d supply, fill #1

## 2023-01-10 ENCOUNTER — Other Ambulatory Visit (HOSPITAL_COMMUNITY): Payer: Self-pay

## 2023-01-11 ENCOUNTER — Other Ambulatory Visit (HOSPITAL_COMMUNITY): Payer: Self-pay

## 2023-03-10 ENCOUNTER — Other Ambulatory Visit (HOSPITAL_COMMUNITY): Payer: Self-pay

## 2023-04-14 ENCOUNTER — Other Ambulatory Visit (HOSPITAL_COMMUNITY): Payer: Self-pay

## 2023-05-19 ENCOUNTER — Other Ambulatory Visit (HOSPITAL_COMMUNITY): Payer: Self-pay

## 2023-05-19 ENCOUNTER — Encounter: Payer: Self-pay | Admitting: Orthopaedic Surgery

## 2023-05-19 MED ORDER — AMOXICILLIN 500 MG PO CAPS
2000.0000 mg | ORAL_CAPSULE | ORAL | 1 refills | Status: DC
  Filled 2023-05-19 – 2023-06-11 (×2): qty 16, 4d supply, fill #0
  Filled 2023-09-03: qty 16, 4d supply, fill #1

## 2023-05-20 ENCOUNTER — Other Ambulatory Visit (HOSPITAL_COMMUNITY): Payer: Self-pay

## 2023-05-30 ENCOUNTER — Other Ambulatory Visit (HOSPITAL_COMMUNITY): Payer: Self-pay

## 2023-06-11 ENCOUNTER — Other Ambulatory Visit (HOSPITAL_COMMUNITY): Payer: Self-pay

## 2023-07-01 ENCOUNTER — Encounter: Payer: Self-pay | Admitting: Emergency Medicine

## 2023-07-01 ENCOUNTER — Ambulatory Visit: Payer: Commercial Managed Care - PPO | Admitting: Emergency Medicine

## 2023-07-01 VITALS — BP 150/98 | HR 70 | Temp 98.5°F | Ht 67.0 in | Wt 228.0 lb

## 2023-07-01 DIAGNOSIS — Z13228 Encounter for screening for other metabolic disorders: Secondary | ICD-10-CM

## 2023-07-01 DIAGNOSIS — Z13220 Encounter for screening for lipoid disorders: Secondary | ICD-10-CM

## 2023-07-01 DIAGNOSIS — Z1329 Encounter for screening for other suspected endocrine disorder: Secondary | ICD-10-CM

## 2023-07-01 DIAGNOSIS — Z0000 Encounter for general adult medical examination without abnormal findings: Secondary | ICD-10-CM

## 2023-07-01 DIAGNOSIS — Z1159 Encounter for screening for other viral diseases: Secondary | ICD-10-CM | POA: Diagnosis not present

## 2023-07-01 DIAGNOSIS — Z7689 Persons encountering health services in other specified circumstances: Secondary | ICD-10-CM

## 2023-07-01 DIAGNOSIS — Z130 Encounter for screening for diseases of the blood and blood-forming organs and certain disorders involving the immune mechanism: Secondary | ICD-10-CM

## 2023-07-01 DIAGNOSIS — Z114 Encounter for screening for human immunodeficiency virus [HIV]: Secondary | ICD-10-CM

## 2023-07-01 DIAGNOSIS — Z1211 Encounter for screening for malignant neoplasm of colon: Secondary | ICD-10-CM

## 2023-07-01 LAB — LIPID PANEL
Cholesterol: 165 mg/dL (ref 0–200)
HDL: 55.2 mg/dL (ref >39.00)
LDL Cholesterol: 93 mg/dL (ref 0–99)
NonHDL: 109.74
Total CHOL/HDL Ratio: 3
Triglycerides: 82 mg/dL (ref 0.0–149.0)
VLDL: 16.4 mg/dL (ref 0.0–40.0)

## 2023-07-01 LAB — COMPREHENSIVE METABOLIC PANEL
ALT: 9 U/L (ref 0–35)
AST: 11 U/L (ref 0–37)
Albumin: 4.5 g/dL (ref 3.5–5.2)
Alkaline Phosphatase: 102 U/L (ref 39–117)
BUN: 15 mg/dL (ref 6–23)
CO2: 26 meq/L (ref 19–32)
Calcium: 9 mg/dL (ref 8.4–10.5)
Chloride: 107 meq/L (ref 96–112)
Creatinine, Ser: 0.76 mg/dL (ref 0.40–1.20)
GFR: 83.1 mL/min (ref >60.00)
Glucose, Bld: 79 mg/dL (ref 70–99)
Potassium: 3.4 meq/L — ABNORMAL LOW (ref 3.5–5.1)
Sodium: 140 meq/L (ref 135–145)
Total Bilirubin: 0.4 mg/dL (ref 0.2–1.2)
Total Protein: 7.3 g/dL (ref 6.0–8.3)

## 2023-07-01 LAB — CBC WITH DIFFERENTIAL/PLATELET
Basophils Absolute: 0 10*3/uL (ref 0.0–0.1)
Basophils Relative: 0.3 % (ref 0.0–3.0)
Eosinophils Absolute: 0.2 10*3/uL (ref 0.0–0.7)
Eosinophils Relative: 1 % (ref 0.0–5.0)
HCT: 35.4 % — ABNORMAL LOW (ref 36.0–46.0)
Hemoglobin: 10.7 g/dL — ABNORMAL LOW (ref 12.0–15.0)
Lymphocytes Relative: 40 % (ref 12.0–46.0)
Lymphs Abs: 2.7 10*3/uL (ref 0.7–4.0)
MCHC: 30.2 g/dL (ref 30.0–36.0)
MCV: 53.4 fL — ABNORMAL LOW (ref 78.0–100.0)
Monocytes Absolute: 0.3 10*3/uL (ref 0.1–1.0)
Monocytes Relative: 6 % (ref 3.0–12.0)
Neutro Abs: 2.1 10*3/uL (ref 1.4–7.7)
Neutrophils Relative %: 53 % (ref 43.0–77.0)
Platelets: 286 10*3/uL (ref 150.0–400.0)
RBC: 6.63 Mil/uL — ABNORMAL HIGH (ref 3.87–5.11)
RDW: 18.4 % — ABNORMAL HIGH (ref 11.5–15.5)
WBC: 5.3 10*3/uL (ref 4.0–10.5)

## 2023-07-01 LAB — HEMOGLOBIN A1C: Hgb A1c MFr Bld: 6.6 % — ABNORMAL HIGH (ref 4.6–6.5)

## 2023-07-01 NOTE — Patient Instructions (Signed)

## 2023-07-01 NOTE — Progress Notes (Signed)
Heather Diaz 62 y.o.   Chief Complaint  Patient presents with   Establish Care    Patient wants to establish care, wants to know if this could be a physical. Pt also c/o of aches she is aware that it could be of age and her arthritis     HISTORY OF PRESENT ILLNESS: This is a 62 y.o. female first visit to this office, here to establish care with me. Requesting physical. Healthy female with a healthy lifestyle Has occasional musculoskeletal diffuse pains No other complaints or medical concerns today.  HPI   Prior to Admission medications   Medication Sig Start Date End Date Taking? Authorizing Provider  ALPRAZolam Prudy Feeler) 0.5 MG tablet Take 1 tablet (0.5 mg total) by mouth daily as needed for anxiety. 01/09/23  Yes   amoxicillin (AMOXIL) 500 MG capsule Take 4 capsules (2,000 mg total) by mouth 1 hour prior to dental appointment. 05/19/23  Yes   cyanocobalamin 2000 MCG tablet Take 2,000 mcg by mouth daily.   Yes [provider]  diclofenac (VOLTAREN) 75 MG EC tablet Take 1 tablet (75 mg total) by mouth 2 (two) times daily as needed. 07/31/22  Yes Cristie Hem, PA-C  metoprolol (TOPROL XL) 200 MG 24 hr tablet Take 1 tablet (200 mg total) by mouth daily. 01/09/23  Yes   Multiple Vitamins-Minerals (ADULT GUMMY) CHEW Chew 1 each by mouth daily.   Yes [provider]  triamterene-hydrochlorothiazide (MAXZIDE) 75-50 MG tablet Take 1 tablet by mouth every morning. 01/09/23  Yes   zolpidem (AMBIEN) 10 MG tablet Take 0.5-1 tablets (5-10 mg total) by mouth at bedtime as needed. 01/09/23  Yes   ALPRAZolam (XANAX) 0.5 MG tablet Take 1 tablet (0.5 mg total) by mouth daily as needed for anxiety Patient not taking: Reported on 07/01/2023 04/18/22     estradiol (VIVELLE-DOT) 0.05 MG/24HR patch Apply 1 patch onto skin twice a week Patient not taking: Reported on 07/01/2023 04/18/22     methocarbamol (ROBAXIN-750) 750 MG tablet Take 1 tablet (750 mg total) by mouth 2 (two) times daily as  needed for muscle spasms. Patient not taking: Reported on 07/01/2023 05/05/22   Cristie Hem, PA-C  metoprolol (TOPROL XL) 200 MG 24 hr tablet Take 1 tablet (200 mg total) by mouth daily. Patient not taking: Reported on 07/01/2023 04/18/22     ondansetron (ZOFRAN) 4 MG tablet Take 1 tablet (4 mg total) by mouth every 8 (eight) hours as needed for nausea or vomiting. Patient not taking: Reported on 07/01/2023 05/05/22   Cristie Hem, PA-C  triamterene-hydrochlorothiazide (MAXZIDE) 75-50 MG tablet Take 1 tablet by mouth every morning. Patient not taking: Reported on 07/01/2023 04/18/22       Allergies  Allergen Reactions   Reglan [Metoclopramide] Other (See Comments)    confusion    Patient Active Problem List   Diagnosis Date Noted   Status post total right knee replacement 05/13/2022    Past Medical History:  Diagnosis Date   Anemia    thalacemia   Anxiety    Arthritis    Hypertension    Insomnia     Past Surgical History:  Procedure Laterality Date   ABDOMINAL HYSTERECTOMY     KNEE ARTHROSCOPY WITH MEDIAL MENISECTOMY Left 12/22/2020   Procedure: LEFT KNEE ARTHROSCOPY WITH PARTIAL MEDIAL MENISECTOMY;  Surgeon: Tarry Kos, MD;  Location: [facility];  Service: Orthopedics;  Laterality: Left;   KNEE ARTHROSCOPY WITH MEDIAL MENISECTOMY Right 10/24/2021   Procedure:  right knee arthroscopy partial medial and partial lateral meniscectomy and synovectomy;  Surgeon: Tarry Kos, MD;  Location: [facility];  Service: Orthopedics;  Laterality: Right;   REDUCTION MAMMAPLASTY Bilateral    TOTAL KNEE ARTHROPLASTY Right 05/13/2022   Procedure: TOTAL KNEE ARTHROPLASTY;  Surgeon: Tarry Kos, MD;  Location: [facility];  Service: Orthopedics;  Laterality: Right;    Social History   Socioeconomic History   Marital status: Divorced    Spouse name: Not on file   Number of children: 2   Years of education: Not on file   Highest education level: 12th grade   Occupational History   Not on file  Tobacco Use   Smoking status: Never   Smokeless tobacco: Never  Vaping Use   Vaping status: Never Used  Substance and Sexual Activity   Alcohol use: Yes    Alcohol/week: 3.0 standard drinks of alcohol    Types: 3 Glasses of wine per week    Comment: maybe 1 glass of wine  2-3 times a week   Drug use: Never   Sexual activity: Not Currently    Birth control/protection: Surgical  Other Topics Concern   Not on file  Social History Narrative   Not on file   Social Determinants of Health   Financial Resource Strain: Low Risk  (06/30/2023)   Overall Financial Resource Strain (CARDIA)    Difficulty of Paying Living Expenses: Not hard at all  Food Insecurity: No Food Insecurity (06/30/2023)   Hunger Vital Sign    Worried About Running Out of Food in the Last Year: Never true    Ran Out of Food in the Last Year: Never true  Transportation Needs: No Transportation Needs (06/30/2023)   PRAPARE - Administrator, Civil Service (Medical): No    Lack of Transportation (Non-Medical): No  Physical Activity: Sufficiently Active (06/30/2023)   Exercise Vital Sign    Days of Exercise per Week: 3 days    Minutes of Exercise per Session: 60 min  Stress: Stress Concern Present (06/30/2023)   [employer] of Occupational Health - Occupational Stress Questionnaire    Feeling of Stress : To some extent  Social Connections: Moderately Isolated (06/30/2023)   Social Connection and Isolation Panel [NHANES]    Frequency of Communication with Friends and Family: Twice a week    Frequency of Social Gatherings with Friends and Family: Once a week    Attends Religious Services: More than 4 times per year    Active Member of [employer] or Organizations: No    Attends Engineer, structural: Not on file    Marital Status: Divorced  Catering manager Violence: Not on file    Family History  Problem Relation Age of Onset   Alcohol abuse Father       Review of Systems  Constitutional: Negative.  Negative for chills and fever.  HENT: Negative.  Negative for congestion and sore throat.   Respiratory: Negative.  Negative for cough and shortness of breath.   Cardiovascular: Negative.  Negative for chest pain and palpitations.  Gastrointestinal:  Negative for abdominal pain, diarrhea, nausea and vomiting.  Genitourinary: Negative.  Negative for dysuria and hematuria.  Musculoskeletal:  Positive for joint pain.  Skin: Negative.  Negative for rash.  Neurological: Negative.  Negative for dizziness and headaches.  All other systems reviewed and are negative.   Today's Vitals   07/01/23 1302  BP: (!) 150/98  Pulse: 70  Temp: 98.5 F (  36.9 C)  TempSrc: Oral  SpO2: 96%  Weight: 228 lb (103.4 kg)  Height: 5\' 7"  (1.702 m)   Body mass index is 35.71 kg/m.   Physical Exam Vitals reviewed.  Constitutional:      Appearance: Normal appearance.  HENT:     Head: Normocephalic.     Right Ear: Tympanic membrane, ear canal and external ear normal.     Left Ear: Tympanic membrane, ear canal and external ear normal.     Mouth/Throat:     Mouth: Mucous membranes are moist.     Pharynx: Oropharynx is clear.  Eyes:     Extraocular Movements: Extraocular movements intact.     Conjunctiva/sclera: Conjunctivae normal.     Pupils: Pupils are equal, round, and reactive to light.  Cardiovascular:     Rate and Rhythm: Normal rate and regular rhythm.     Pulses: Normal pulses.     Heart sounds: Normal heart sounds.  Pulmonary:     Effort: Pulmonary effort is normal.     Breath sounds: Normal breath sounds.  Abdominal:     Palpations: Abdomen is soft.     Tenderness: There is no abdominal tenderness.  Musculoskeletal:     Cervical back: No tenderness.  Lymphadenopathy:     Cervical: No cervical adenopathy.  Skin:    General: Skin is warm and dry.     Capillary Refill: Capillary refill takes less than 2 seconds.  Neurological:      General: No focal deficit present.     Mental Status: She is alert and oriented to person, place, and time.  Psychiatric:        Mood and Affect: Mood normal.        Behavior: Behavior normal.      ASSESSMENT & PLAN: Problem List Items Addressed This Visit   None Visit Diagnoses     Routine general medical examination at a health care facility    -  Primary   Relevant Orders   CBC with Differential   Comprehensive metabolic panel   Hemoglobin A1c   Lipid panel   HIV antibody   Hepatitis C antibody screen   Encounter to establish care       Need for hepatitis C screening test       Relevant Orders   Hepatitis C antibody screen   Encounter for screening for HIV       Relevant Orders   HIV antibody   Colon cancer screening       Relevant Orders   Ambulatory referral to Gastroenterology   Screening for deficiency anemia       Relevant Orders   CBC with Differential   Screening for lipoid disorders       Relevant Orders   Lipid panel   Screening for endocrine, metabolic and immunity disorder       Relevant Orders   Comprehensive metabolic panel   Hemoglobin A1c      Modifiable risk factors discussed with patient. Anticipatory guidance according to age provided. The following topics were also discussed: Social Determinants of Health Smoking.  Non-smoker Diet and nutrition and need to decrease amount of daily carbohydrate intake and daily calories and increase amount of plant-based protein in her diet Benefits of exercise Cancer screening and need for colon cancer screening with colonoscopy Vaccinations review and recommendations Cardiovascular risk assessment and need for blood work Mental health including depression and anxiety Fall and accident prevention  Patient Instructions  Health Maintenance, Female Adopting a  healthy lifestyle and getting preventive care are important in promoting health and wellness. Ask your health care provider about: The right  schedule for you to have regular tests and exams. Things you can do on your own to prevent diseases and keep yourself healthy. What should I know about diet, weight, and exercise? Eat a healthy diet  Eat a diet that includes plenty of vegetables, fruits, low-fat dairy products, and lean protein. Do not eat a lot of foods that are high in solid fats, added sugars, or sodium. Maintain a healthy weight Body mass index (BMI) is used to identify weight problems. It estimates body fat based on height and weight. Your health care provider can help determine your BMI and help you achieve or maintain a healthy weight. Get regular exercise Get regular exercise. This is one of the most important things you can do for your health. Most adults should: Exercise for at least 150 minutes each week. The exercise should increase your heart rate and make you sweat (moderate-intensity exercise). Do strengthening exercises at least twice a week. This is in addition to the moderate-intensity exercise. Spend less time sitting. Even light physical activity can be beneficial. Watch cholesterol and blood lipids Have your blood tested for lipids and cholesterol at 64 years of age, then have this test every 5 years. Have your cholesterol levels checked more often if: Your lipid or cholesterol levels are high. You are older than 64 years of age. You are at high risk for heart disease. What should I know about cancer screening? Depending on your health history and family history, you may need to have cancer screening at various ages. This may include screening for: Breast cancer. Cervical cancer. Colorectal cancer. Skin cancer. Lung cancer. What should I know about heart disease, diabetes, and high blood pressure? Blood pressure and heart disease High blood pressure causes heart disease and increases the risk of stroke. This is more likely to develop in people who have high blood pressure readings or are  overweight. Have your blood pressure checked: Every 3-5 years if you are 90-29 years of age. Every year if you are 5 years old or older. Diabetes Have regular diabetes screenings. This checks your fasting blood sugar level. Have the screening done: Once every three years after age 78 if you are at a normal weight and have a low risk for diabetes. More often and at a younger age if you are overweight or have a high risk for diabetes. What should I know about preventing infection? Hepatitis B If you have a higher risk for hepatitis B, you should be screened for this virus. Talk with your health care provider to find out if you are at risk for hepatitis B infection. Hepatitis C Testing is recommended for: Everyone born from [birth year] through 1967. Anyone with known risk factors for hepatitis C. Sexually transmitted infections (STIs) Get screened for STIs, including gonorrhea and chlamydia, if: You are sexually active and are younger than 64 years of age. You are older than 64 years of age and your health care provider tells you that you are at risk for this type of infection. Your sexual activity has changed since you were last screened, and you are at increased risk for chlamydia or gonorrhea. Ask your health care provider if you are at risk. Ask your health care provider about whether you are at high risk for HIV. Your health care provider may recommend a prescription medicine to help prevent HIV infection. If you choose  to take medicine to prevent HIV, you should first get tested for HIV. You should then be tested every 3 months for as long as you are taking the medicine. Pregnancy If you are about to stop having your period (premenopausal) and you may become pregnant, seek counseling before you get pregnant. Take 400 to 800 micrograms (mcg) of folic acid every day if you become pregnant. Ask for birth control (contraception) if you want to prevent pregnancy. Osteoporosis and  menopause Osteoporosis is a disease in which the bones lose minerals and strength with aging. This can result in bone fractures. If you are 29 years old or older, or if you are at risk for osteoporosis and fractures, ask your health care provider if you should: Be screened for bone loss. Take a calcium or vitamin D supplement to lower your risk of fractures. Be given hormone replacement therapy (HRT) to treat symptoms of menopause. Follow these instructions at home: Alcohol use Do not drink alcohol if: Your health care provider tells you not to drink. You are pregnant, may be pregnant, or are planning to become pregnant. If you drink alcohol: Limit how much you have to: 0-1 drink a day. Know how much alcohol is in your drink. In the [place] one drink equals one 12 oz bottle of beer (355 mL), one 5 oz glass of wine (148 mL), or one 1 oz glass of hard liquor (44 mL). Lifestyle Do not use any products that contain nicotine or tobacco. These products include cigarettes, chewing tobacco, and vaping devices, such as e-cigarettes. If you need help quitting, ask your health care provider. Do not use street drugs. Do not share needles. Ask your health care provider for help if you need support or information about quitting drugs. General instructions Schedule regular health, dental, and eye exams. Stay current with your vaccines. Tell your health care provider if: You often feel depressed. You have ever been abused or do not feel safe at home. Summary Adopting a healthy lifestyle and getting preventive care are important in promoting health and wellness. Follow your health care provider's instructions about healthy diet, exercising, and getting tested or screened for diseases. Follow your health care provider's instructions on monitoring your cholesterol and blood pressure. This information is not intended to replace advice given to you by your health care provider. Make sure you discuss any  questions you have with your health care provider. Document Revised: 12/11/2020 Document Reviewed: 12/11/2020 Elsevier Patient Education  2024 Elsevier Inc.      Edwina Barth, MD [facility]

## 2023-07-02 LAB — HEPATITIS C ANTIBODY: Hepatitis C Ab: NONREACTIVE

## 2023-07-02 LAB — HIV ANTIBODY (ROUTINE TESTING W REFLEX): HIV 1&2 Ab, 4th Generation: NONREACTIVE

## 2023-09-03 ENCOUNTER — Other Ambulatory Visit: Payer: Self-pay

## 2023-09-03 ENCOUNTER — Other Ambulatory Visit (HOSPITAL_COMMUNITY): Payer: Self-pay

## 2023-09-04 ENCOUNTER — Other Ambulatory Visit: Payer: Self-pay | Admitting: Emergency Medicine

## 2023-09-04 NOTE — Telephone Encounter (Signed)
Copied from CRM [id number]. Topic: Clinical - Medication Refill >> Sep 04, 2023  7:55 AM Kathryne Eriksson wrote: Most Recent Primary Care Visit:  Provider: Georgina Quint  Department: [facility]  Visit Type: NEW PATIENT  Date: 07/01/2023  Medication: ALPRAZolam (XANAX) 0.5 MG tablet , methocarbamol (ROBAXIN-750) 750 MG tablet , metoprolol (TOPROL XL) 200 MG 24 hr tablet , zolpidem (AMBIEN) 10 MG tablet  Has the patient contacted their pharmacy? Yes (Agent: If no, request that the patient contact the pharmacy for the refill. If patient does not wish to contact the pharmacy document the reason why and proceed with request.) (Agent: If yes, when and what did the pharmacy advise?)  Is this the correct pharmacy for this prescription? Yes If no, delete pharmacy and type the correct one.  This is the patient's preferred pharmacy:  [facility] [address] [place] [address] Phone: [phone] Fax: [phone]    Has the prescription been filled recently? No  Is the patient out of the medication? Yes  Has the patient been seen for an appointment in the last year OR does the patient have an upcoming appointment? Yes  Can we respond through MyChart? Yes  Agent: Please be advised that Rx refills may take up to 3 business days. We ask that you follow-up with your pharmacy.

## 2023-09-05 ENCOUNTER — Other Ambulatory Visit (HOSPITAL_COMMUNITY): Payer: Self-pay

## 2023-09-05 ENCOUNTER — Other Ambulatory Visit: Payer: Self-pay

## 2023-09-05 MED ORDER — TRIAMTERENE-HCTZ 75-50 MG PO TABS
1.0000 | ORAL_TABLET | Freq: Every morning | ORAL | 0 refills | Status: DC
  Filled 2023-09-05: qty 90, 90d supply, fill #0

## 2023-09-05 MED ORDER — ALPRAZOLAM 0.5 MG PO TABS
0.5000 mg | ORAL_TABLET | Freq: Every day | ORAL | 2 refills | Status: DC | PRN
  Filled 2023-09-05: qty 30, 30d supply, fill #0
  Filled 2023-10-06 – 2023-10-18 (×2): qty 30, 30d supply, fill #1
  Filled 2023-12-10: qty 30, 30d supply, fill #2

## 2023-09-05 MED ORDER — ZOLPIDEM TARTRATE 10 MG PO TABS
5.0000 mg | ORAL_TABLET | Freq: Every evening | ORAL | 1 refills | Status: DC | PRN
  Filled 2023-09-05: qty 30, 30d supply, fill #0
  Filled 2023-10-06 – 2023-10-18 (×2): qty 30, 30d supply, fill #1

## 2023-09-05 MED ORDER — METOPROLOL SUCCINATE ER 200 MG PO TB24
200.0000 mg | ORAL_TABLET | Freq: Every day | ORAL | 1 refills | Status: DC
  Filled 2023-09-05 – 2023-12-10 (×2): qty 90, 90d supply, fill #0
  Filled 2024-03-10: qty 90, 90d supply, fill #1

## 2023-09-05 MED ORDER — METHOCARBAMOL 750 MG PO TABS
750.0000 mg | ORAL_TABLET | Freq: Two times a day (BID) | ORAL | 2 refills | Status: AC | PRN
  Filled 2023-09-05: qty 20, 10d supply, fill #0
  Filled 2023-12-10: qty 20, 10d supply, fill #1

## 2023-09-05 MED ORDER — METOPROLOL SUCCINATE ER 200 MG PO TB24
200.0000 mg | ORAL_TABLET | Freq: Every day | ORAL | 0 refills | Status: DC
  Filled 2023-09-05: qty 90, 90d supply, fill #0

## 2023-09-08 ENCOUNTER — Other Ambulatory Visit (HOSPITAL_COMMUNITY): Payer: Self-pay

## 2023-09-08 ENCOUNTER — Telehealth: Payer: Self-pay | Admitting: Emergency Medicine

## 2023-09-08 NOTE — Telephone Encounter (Signed)
Copied from CRM [id number]. Topic: Clinical - Prescription Issue >> Sep 08, 2023 12:40 PM Turkey A wrote: Reason for CRM: Patient called wanting to know why she was not written 90 supply for Ambien instead only 30 days-would like a call back

## 2023-09-09 ENCOUNTER — Other Ambulatory Visit (HOSPITAL_COMMUNITY): Payer: Self-pay

## 2023-09-09 ENCOUNTER — Other Ambulatory Visit: Payer: Self-pay | Admitting: Emergency Medicine

## 2023-09-09 ENCOUNTER — Encounter: Payer: Self-pay | Admitting: Radiology

## 2023-09-09 NOTE — Telephone Encounter (Signed)
She has plenty of refills.  Should not be taking it every day.  Thanks.

## 2023-10-06 ENCOUNTER — Other Ambulatory Visit (HOSPITAL_COMMUNITY): Payer: Self-pay

## 2023-10-11 IMAGING — MR MR KNEE*R* W/O CM
4 of 6 series · 19 of 40 positions shown · non-contrast
Comparison: Right knee radiographs 10/09/2021

CLINICAL DATA: Right knee pain for the past month. Pain when
getting up from sitting position.

EXAM:
MRI OF THE RIGHT KNEE WITHOUT CONTRAST
TECHNIQUE: Multiplanar, multisequence MR imaging of the knee was performed. No
intravenous contrast was administered.

[Series 3: T2 fat-sat · axial · 4.0mm · 0.66mm/px · z∈[-18,+87]mm · 3 of 27 slices shown (1 of 2)]
[im 6/27]
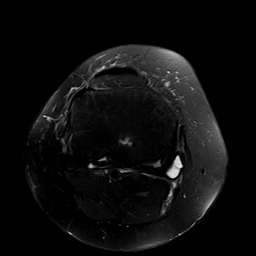
[im 16/27]
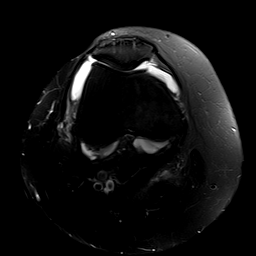
[im 27/27]
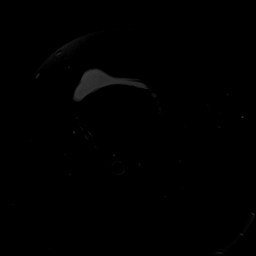

[Series 4: T2 fat-sat · coronal · 4.0mm · 0.31mm/px · 3 of 29 slices shown (2 of 2)]
[im 6/29]
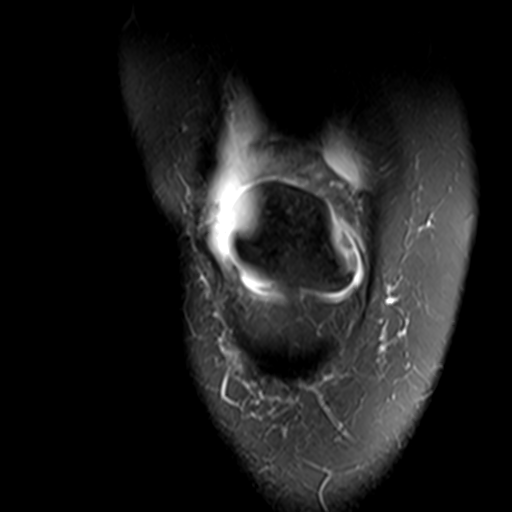
[im 17/29]
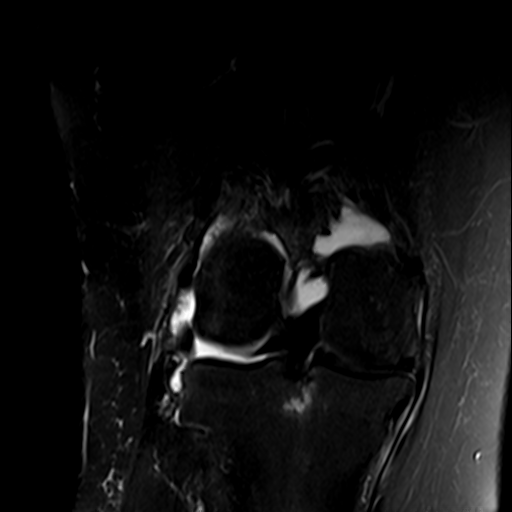
[im 29/29]
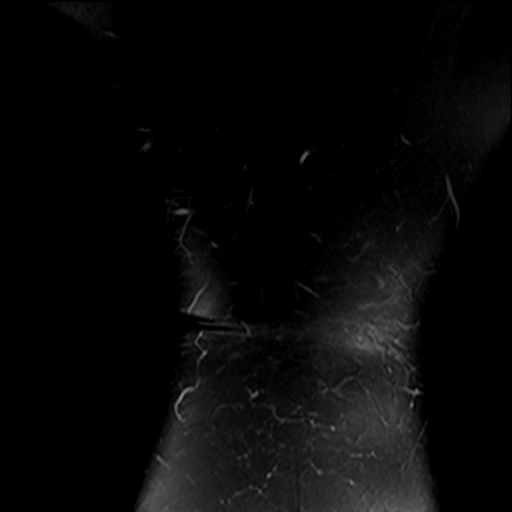

[Series 6: PD fat-sat · coronal · 3.0mm · 0.31mm/px · 8 of 39 slices shown (1 of 2)]
[im 1/39]
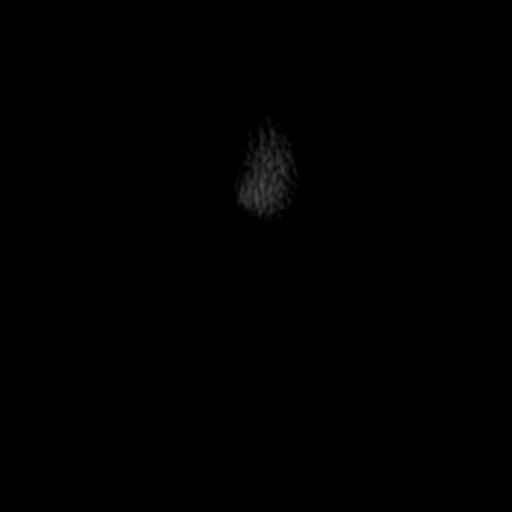
[im 6/39]
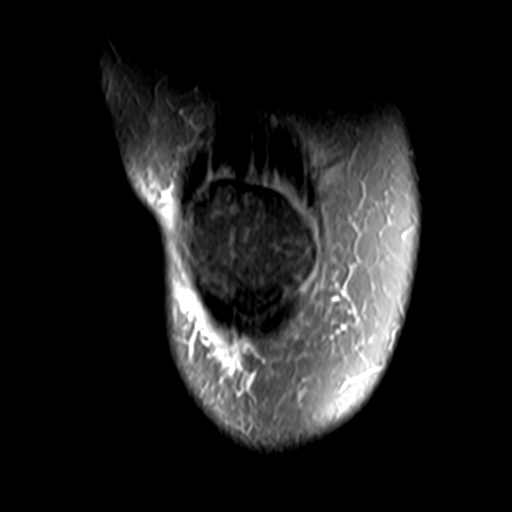
[im 11/39]
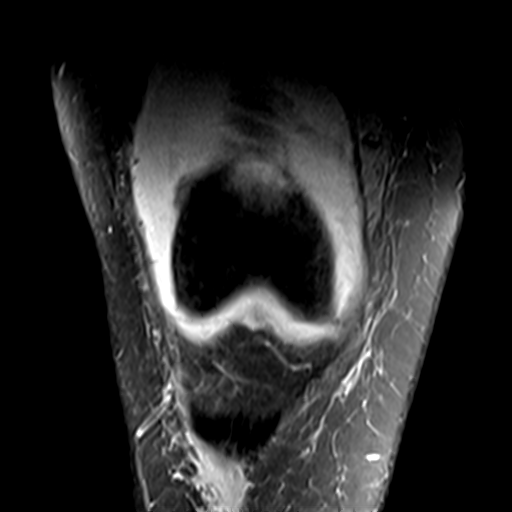
[im 17/39]
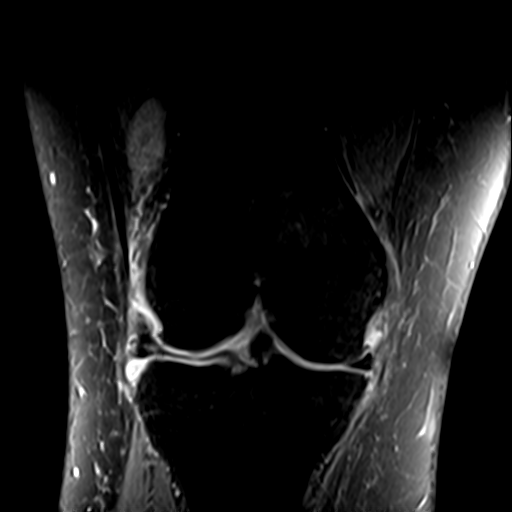
[im 22/39]
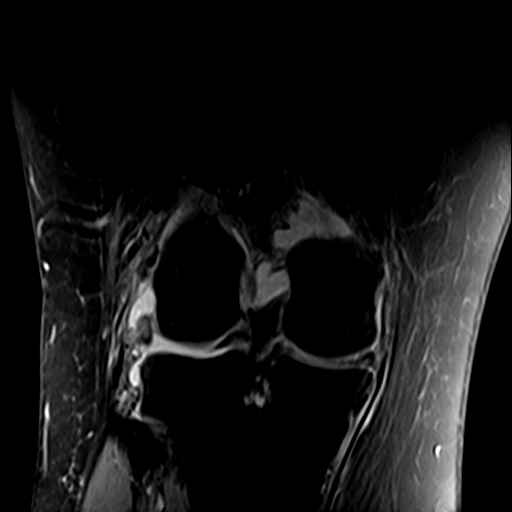
[im 28/39]
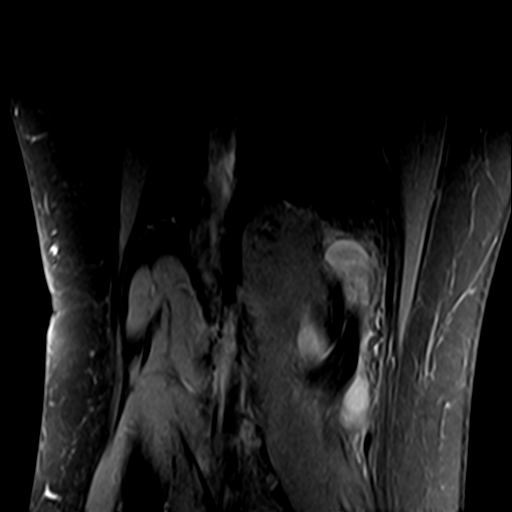
[im 33/39]
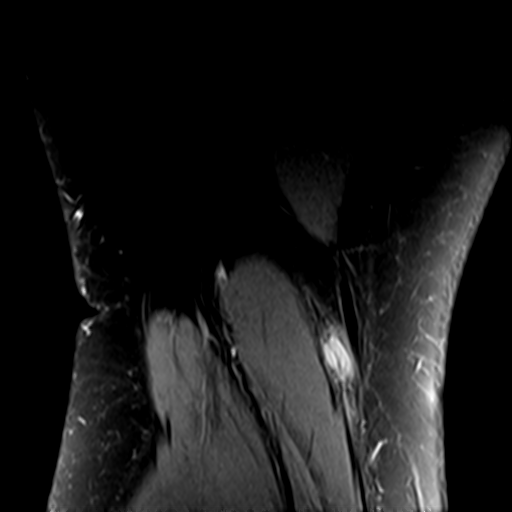
[im 39/39]
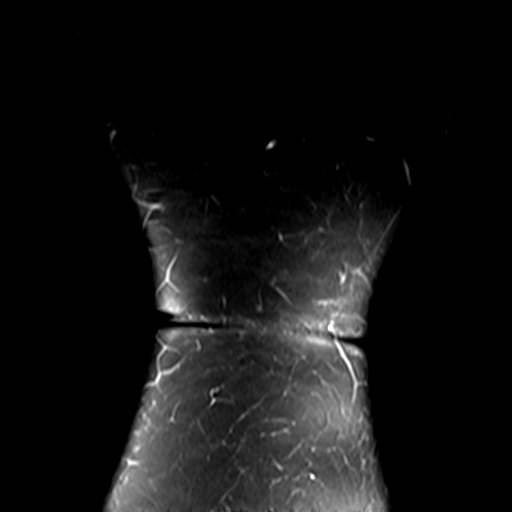

[Series 8: PD fat-sat · sagittal · 3.0mm · 0.31mm/px · 5 of 30 slices shown (2 of 2)]
[im 1/30]
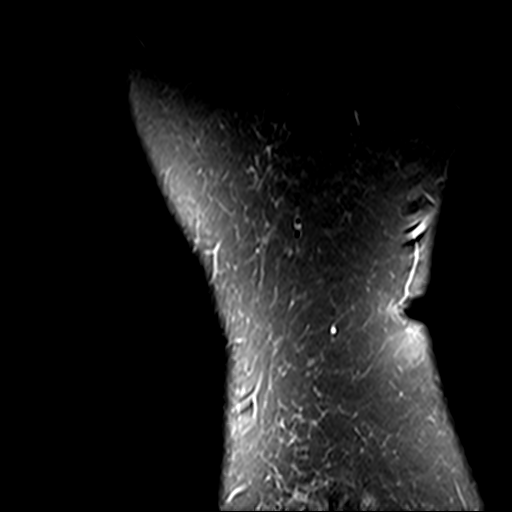
[im 5/30]
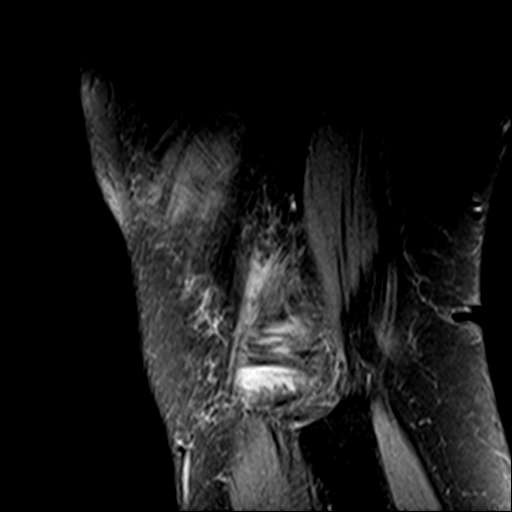
[im 10/30]
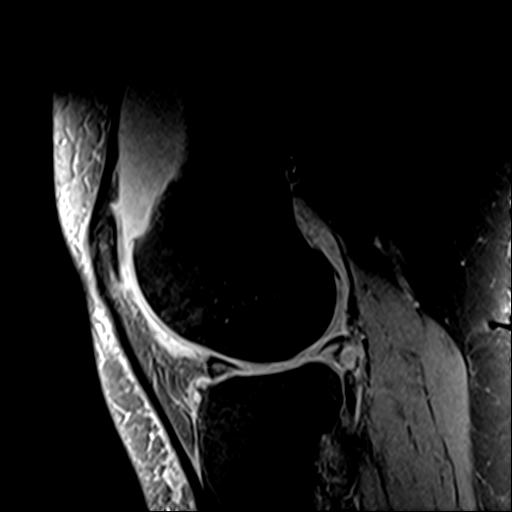
[im 15/30]
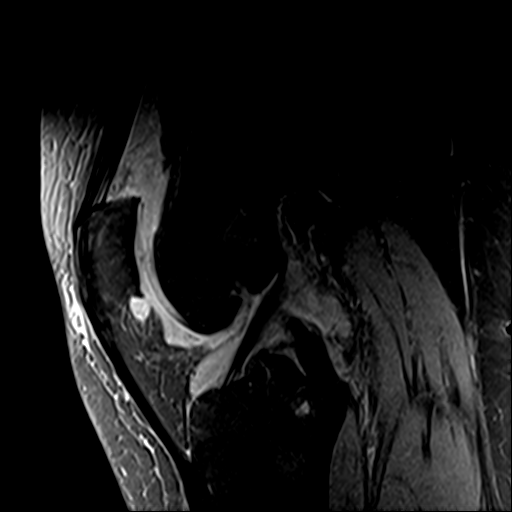
[im 25/30]
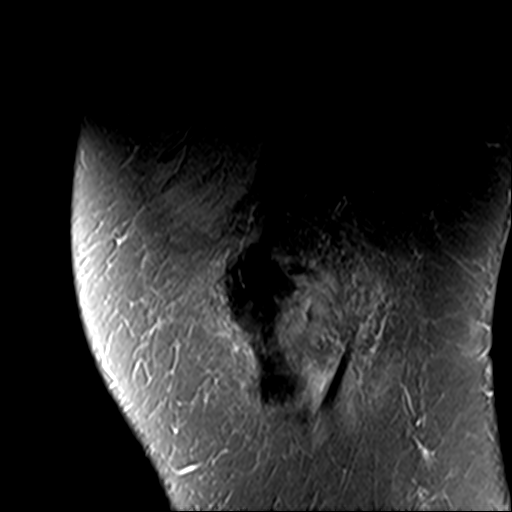

[19 of 40 positions shown; findings below may reference images not displayed]

FINDINGS: MENISCI

Medial meniscus: There is abnormal increased proton density signal
throughout the superior aspect of the root of the posterior horn of
the medial meniscus, a focal degenerative tear (sagittal series 8,
image 17). There is oblique linear increased signal within the rest
of the posterior horn of the medial meniscus extending through the
inferior articular surface of the central third of the meniscal
triangle (sagittal images 20 and 21) and the inferior articular
surface of the middle of the peripheral thirds of the meniscal
triangle more medially (sagittal images 22 and 23). This
undersurface tear extends through the junction of the middle and
peripheral thirds of the meniscal triangle of the posterior segment
of the body of the medial meniscus (coronal images 17 through 21).

Lateral meniscus: There is diffuse abnormal increased signal
throughout the majority of the posterior horn body and anterior horn
of the lateral meniscus. There is absence of normal meniscal tissue
within the 10 mm of the posterior horn of the lateral meniscus
closer to the junction with body of the lateral meniscus (coronal
image 15, sagittal images 7 through 10). There is moderate to
high-grade extrusion of the body of the lateral meniscus. There is
abnormal increased linear signal indicating moderately thick bands
of undersurface tears throughout the posterior horn (sagittal images
8 through 10), free edge of the body (coronal images 20 through 23),
and inferior articular surface of the anterior horn (sagittal images
7 through 10) of the lateral meniscus.

LIGAMENTS

Cruciates: The ACL and PCL are intact.

Collaterals: The medial collateral ligament is intact. The fibular
collateral ligament, biceps femoris tendon, iliotibial band, and
popliteus tendon are intact.

CARTILAGE

Patellofemoral: Mild thinning of the superior aspect of the medial
and lateral trochlear cartilage.

Medial: Mild-to-moderate thinning of the medial aspect of the
weight-bearing medial femoral condyle and medial tibial plateau
cartilage with minimal subchondral marrow edema.

Lateral: Moderate thinning of the weight-bearing lateral femoral
condyle cartilage diffusely. Mild lateral tibial plateau cartilage
thinning.

Joint: Moderatejoint effusion. Normal Hoffa's fat pad. No plical
thickening.

Popliteal Fossa:  Mild-to-moderate Baker's cyst.

Extensor Mechanism:  Intact quadriceps tendon and patellar tendon.

Bones:  No acute fracture or dislocation.

Other: Mild peripheral medial compartment degenerative
osteophytosis.

There is mild to moderate fluid within the semimembranosus-tibial
collateral ligament bursa (axial images 20 through 26, coronal
images 12 through 15).
IMPRESSION: :
IMPRESSION: 1. Mild tricompartmental cartilage degenerative changes.
2. Degenerative changes and undersurface tears within the posterior
horn and body of the medial meniscus.
3. High-grade degenerative changes and tears throughout the
posterior horn, body and anterior horn lateral meniscus with absence
of meniscal tissue within the posterior horn extending into the
junction with the body.
4. Moderate joint effusion.
5. Mild-to-moderate Baker's cyst.
6. Mild-to-moderate semimembranosus-tibial collateral ligament
bursitis.

## 2023-10-14 ENCOUNTER — Other Ambulatory Visit (HOSPITAL_COMMUNITY): Payer: Self-pay

## 2023-10-15 ENCOUNTER — Other Ambulatory Visit (HOSPITAL_COMMUNITY): Payer: Self-pay

## 2023-10-18 ENCOUNTER — Other Ambulatory Visit: Payer: Self-pay

## 2023-10-18 ENCOUNTER — Other Ambulatory Visit (HOSPITAL_COMMUNITY): Payer: Self-pay

## 2023-11-08 ENCOUNTER — Other Ambulatory Visit (HOSPITAL_COMMUNITY): Payer: Self-pay

## 2023-11-12 ENCOUNTER — Other Ambulatory Visit (HOSPITAL_COMMUNITY): Payer: Self-pay

## 2023-12-10 ENCOUNTER — Other Ambulatory Visit: Payer: Self-pay

## 2023-12-10 ENCOUNTER — Other Ambulatory Visit (HOSPITAL_COMMUNITY): Payer: Self-pay

## 2023-12-10 ENCOUNTER — Other Ambulatory Visit: Payer: Self-pay | Admitting: Emergency Medicine

## 2023-12-10 MED ORDER — TRIAMTERENE-HCTZ 75-50 MG PO TABS
1.0000 | ORAL_TABLET | Freq: Every morning | ORAL | 0 refills | Status: DC
  Filled 2023-12-10 (×2): qty 90, 90d supply, fill #0

## 2023-12-10 MED ORDER — ZOLPIDEM TARTRATE 10 MG PO TABS
5.0000 mg | ORAL_TABLET | Freq: Every evening | ORAL | 1 refills | Status: DC | PRN
  Filled 2023-12-10: qty 30, 30d supply, fill #0
  Filled 2024-01-11: qty 30, 30d supply, fill #1

## 2024-01-08 ENCOUNTER — Other Ambulatory Visit (HOSPITAL_COMMUNITY): Payer: Self-pay | Admitting: Family Medicine

## 2024-01-08 ENCOUNTER — Ambulatory Visit (HOSPITAL_COMMUNITY)
Admission: RE | Admit: 2024-01-08 | Discharge: 2024-01-08 | Disposition: A | Discharge status: Home / Self Care | Payer: Self-pay | Source: Ambulatory Visit | Attending: Family Medicine | Admitting: Family Medicine

## 2024-01-08 DIAGNOSIS — M25562 Pain in left knee: Secondary | ICD-10-CM | POA: Diagnosis not present

## 2024-01-08 DIAGNOSIS — M1712 Unilateral primary osteoarthritis, left knee: Secondary | ICD-10-CM | POA: Diagnosis not present

## 2024-01-12 ENCOUNTER — Other Ambulatory Visit: Payer: Self-pay

## 2024-02-08 ENCOUNTER — Other Ambulatory Visit: Payer: Self-pay | Admitting: Emergency Medicine

## 2024-02-08 MED ORDER — ZOLPIDEM TARTRATE 10 MG PO TABS
5.0000 mg | ORAL_TABLET | Freq: Every evening | ORAL | 1 refills | Status: DC | PRN
  Filled 2024-02-08 – 2024-02-11 (×2): qty 30, 30d supply, fill #0
  Filled 2024-03-10: qty 30, 30d supply, fill #1

## 2024-02-09 ENCOUNTER — Other Ambulatory Visit (HOSPITAL_COMMUNITY): Payer: Self-pay

## 2024-02-10 ENCOUNTER — Other Ambulatory Visit (HOSPITAL_COMMUNITY): Payer: Self-pay

## 2024-02-11 ENCOUNTER — Other Ambulatory Visit (HOSPITAL_COMMUNITY): Payer: Self-pay

## 2024-02-18 ENCOUNTER — Other Ambulatory Visit (HOSPITAL_COMMUNITY): Payer: Self-pay

## 2024-02-20 ENCOUNTER — Encounter: Payer: Self-pay | Admitting: Advanced Practice Midwife

## 2024-02-24 ENCOUNTER — Other Ambulatory Visit (HOSPITAL_COMMUNITY): Payer: Self-pay

## 2024-03-10 ENCOUNTER — Other Ambulatory Visit (HOSPITAL_COMMUNITY): Payer: Self-pay

## 2024-03-12 ENCOUNTER — Other Ambulatory Visit (HOSPITAL_COMMUNITY): Payer: Self-pay

## 2024-03-12 MED ORDER — TRIAMTERENE-HCTZ 75-50 MG PO TABS
1.0000 | ORAL_TABLET | Freq: Every morning | ORAL | 0 refills | Status: AC
  Filled 2024-03-12 – 2024-07-07 (×2): qty 30, 30d supply, fill #0

## 2024-03-22 ENCOUNTER — Other Ambulatory Visit (HOSPITAL_COMMUNITY): Payer: Self-pay

## 2024-03-30 ENCOUNTER — Other Ambulatory Visit (HOSPITAL_COMMUNITY): Payer: Self-pay

## 2024-03-30 ENCOUNTER — Telehealth: Admitting: Physician Assistant

## 2024-03-30 ENCOUNTER — Encounter

## 2024-03-30 DIAGNOSIS — K219 Gastro-esophageal reflux disease without esophagitis: Secondary | ICD-10-CM

## 2024-03-30 DIAGNOSIS — R1011 Right upper quadrant pain: Secondary | ICD-10-CM | POA: Diagnosis not present

## 2024-03-30 DIAGNOSIS — I10 Essential (primary) hypertension: Secondary | ICD-10-CM | POA: Diagnosis not present

## 2024-03-30 MED ORDER — METOPROLOL SUCCINATE ER 200 MG PO TB24
200.0000 mg | ORAL_TABLET | Freq: Every day | ORAL | 1 refills | Status: AC
  Filled 2024-03-30: qty 30, 30d supply, fill #0
  Filled 2024-07-07: qty 30, 30d supply, fill #1

## 2024-03-30 MED ORDER — OMEPRAZOLE 40 MG PO CPDR
40.0000 mg | DELAYED_RELEASE_CAPSULE | Freq: Every morning | ORAL | 3 refills | Status: AC
  Filled 2024-03-30 (×3): qty 90, 90d supply, fill #0
  Filled 2024-07-07: qty 90, 90d supply, fill #1

## 2024-03-30 NOTE — Progress Notes (Signed)
  Because of concern for h pylori infection and more substantial GERD symptoms, warranting need for exam and testing,  I recommend that you be seen in a face-to-face visit.   NOTE: There will be NO CHARGE for this E-Visit   If you are having a true medical emergency, please call 911.     For an urgent face to face visit, [facility] has multiple urgent care centers for your convenience.  Click the link below for the full list of locations and hours, walk-in wait times, appointment scheduling options and driving directions:  Urgent Care - [place], [place], [place], [place], [place], [place]  [place]     Your MyChart E-visit questionnaire answers were reviewed by a board certified advanced clinical practitioner to complete your personal care plan based on your specific symptoms.    Thank you for using e-Visits.

## 2024-03-31 ENCOUNTER — Other Ambulatory Visit (HOSPITAL_COMMUNITY): Payer: Self-pay

## 2024-04-12 ENCOUNTER — Other Ambulatory Visit: Payer: Self-pay | Admitting: Emergency Medicine

## 2024-04-13 ENCOUNTER — Other Ambulatory Visit (HOSPITAL_COMMUNITY): Payer: Self-pay

## 2024-04-13 MED ORDER — ZOLPIDEM TARTRATE 10 MG PO TABS
5.0000 mg | ORAL_TABLET | Freq: Every evening | ORAL | 1 refills | Status: DC | PRN
  Filled 2024-04-13: qty 30, 30d supply, fill #0
  Filled 2024-05-11 (×2): qty 30, 30d supply, fill #1

## 2024-04-13 NOTE — Telephone Encounter (Signed)
 Pt called and stated that she normally receives a 90 day supply and she asked for a nurse to call to explain she is unable to get a script for 90 days. She mentioned that her previous doctor always gave her 17. Please call and advise by expected turn around time.

## 2024-05-10 ENCOUNTER — Other Ambulatory Visit (HOSPITAL_COMMUNITY): Payer: Self-pay

## 2024-05-11 ENCOUNTER — Other Ambulatory Visit: Payer: Self-pay

## 2024-05-11 ENCOUNTER — Other Ambulatory Visit (HOSPITAL_COMMUNITY): Payer: Self-pay

## 2024-06-09 ENCOUNTER — Other Ambulatory Visit: Payer: Self-pay | Admitting: Emergency Medicine

## 2024-06-09 ENCOUNTER — Other Ambulatory Visit (HOSPITAL_COMMUNITY): Payer: Self-pay

## 2024-06-09 MED ORDER — ZOLPIDEM TARTRATE 10 MG PO TABS
5.0000 mg | ORAL_TABLET | Freq: Every evening | ORAL | 1 refills | Status: DC | PRN
  Filled 2024-06-09: qty 30, 30d supply, fill #0
  Filled 2024-07-07: qty 30, 30d supply, fill #1

## 2024-07-07 ENCOUNTER — Other Ambulatory Visit: Payer: Self-pay

## 2024-07-07 ENCOUNTER — Other Ambulatory Visit (HOSPITAL_COMMUNITY): Payer: Self-pay

## 2024-07-08 ENCOUNTER — Encounter: Payer: Commercial Managed Care - PPO | Admitting: Emergency Medicine

## 2024-08-02 ENCOUNTER — Other Ambulatory Visit (HOSPITAL_COMMUNITY): Payer: Self-pay

## 2024-08-02 MED ORDER — ZOLPIDEM TARTRATE 10 MG PO TABS
5.0000 mg | ORAL_TABLET | Freq: Every evening | ORAL | 0 refills | Status: DC | PRN
  Filled 2024-08-02: qty 30, 30d supply, fill #0

## 2024-08-04 ENCOUNTER — Other Ambulatory Visit (HOSPITAL_COMMUNITY): Payer: Self-pay

## 2024-08-04 MED ORDER — ZOLPIDEM TARTRATE 10 MG PO TABS
5.0000 mg | ORAL_TABLET | Freq: Every evening | ORAL | 0 refills | Status: AC | PRN
  Filled 2024-08-04: qty 90, 90d supply, fill #0
